# Patient Record
Sex: Male | Born: 1940
Health system: Southern US, Community
[De-identification: ages and names within clinical notes are randomized; demographics above are authoritative.]

## PROBLEM LIST (undated history)

## (undated) DIAGNOSIS — D696 Thrombocytopenia, unspecified: Secondary | ICD-10-CM

## (undated) DIAGNOSIS — I10 Essential (primary) hypertension: Secondary | ICD-10-CM

## (undated) DIAGNOSIS — E7800 Pure hypercholesterolemia, unspecified: Secondary | ICD-10-CM

## (undated) DIAGNOSIS — R195 Other fecal abnormalities: Secondary | ICD-10-CM

## (undated) DIAGNOSIS — C61 Malignant neoplasm of prostate: Secondary | ICD-10-CM

## (undated) DIAGNOSIS — E538 Deficiency of other specified B group vitamins: Secondary | ICD-10-CM

## (undated) DIAGNOSIS — G500 Trigeminal neuralgia: Secondary | ICD-10-CM

## (undated) DIAGNOSIS — F419 Anxiety disorder, unspecified: Secondary | ICD-10-CM

## (undated) HISTORY — DX: Thrombocytopenia, unspecified: D69.6

## (undated) HISTORY — DX: Trigeminal neuralgia: G50.0

## (undated) HISTORY — DX: Malignant neoplasm of prostate: C61

## (undated) HISTORY — DX: Other fecal abnormalities: R19.5

## (undated) HISTORY — DX: Deficiency of other specified B group vitamins: E53.8

## (undated) HISTORY — PX: PROSTATE SURGERY: SHX751

## (undated) HISTORY — DX: Anxiety disorder, unspecified: F41.9

## (undated) HISTORY — DX: Pure hypercholesterolemia, unspecified: E78.00

## (undated) HISTORY — DX: Essential (primary) hypertension: I10

## (undated) HISTORY — PX: HERNIA REPAIR: SHX51

---

## 2009-06-01 ENCOUNTER — Ambulatory Visit: Admission: RE | Admit: 2009-06-01 | Discharge: 2009-06-05 | Payer: Self-pay | Admitting: Radiation Oncology

## 2009-09-22 ENCOUNTER — Ambulatory Visit: Admission: RE | Admit: 2009-09-22 | Discharge: 2009-12-15 | Payer: Self-pay | Admitting: Radiation Oncology

## 2009-10-30 ENCOUNTER — Ambulatory Visit (HOSPITAL_BASED_OUTPATIENT_CLINIC_OR_DEPARTMENT_OTHER): Admission: RE | Admit: 2009-10-30 | Discharge: 2009-10-30 | Payer: Self-pay | Admitting: Urology

## 2010-01-16 ENCOUNTER — Ambulatory Visit: Admission: RE | Admit: 2010-01-16 | Discharge: 2010-01-19 | Payer: Self-pay | Admitting: Radiation Oncology

## 2010-06-01 ENCOUNTER — Ambulatory Visit: Payer: Self-pay | Admitting: Urology

## 2010-06-08 ENCOUNTER — Ambulatory Visit (INDEPENDENT_AMBULATORY_CARE_PROVIDER_SITE_OTHER): Payer: Medicare Other | Admitting: Urology

## 2010-06-08 DIAGNOSIS — R972 Elevated prostate specific antigen [PSA]: Secondary | ICD-10-CM

## 2010-06-08 DIAGNOSIS — Z8546 Personal history of malignant neoplasm of prostate: Secondary | ICD-10-CM

## 2010-12-28 ENCOUNTER — Ambulatory Visit (INDEPENDENT_AMBULATORY_CARE_PROVIDER_SITE_OTHER): Payer: Medicare Other | Admitting: Urology

## 2010-12-28 DIAGNOSIS — N402 Nodular prostate without lower urinary tract symptoms: Secondary | ICD-10-CM

## 2010-12-28 DIAGNOSIS — Z8546 Personal history of malignant neoplasm of prostate: Secondary | ICD-10-CM

## 2011-06-24 ENCOUNTER — Encounter: Payer: Self-pay | Admitting: *Deleted

## 2011-06-28 ENCOUNTER — Ambulatory Visit (INDEPENDENT_AMBULATORY_CARE_PROVIDER_SITE_OTHER): Payer: Medicare Other | Admitting: Urology

## 2011-06-28 DIAGNOSIS — Z8546 Personal history of malignant neoplasm of prostate: Secondary | ICD-10-CM

## 2011-12-27 ENCOUNTER — Ambulatory Visit: Payer: Medicare Other | Admitting: Urology

## 2012-01-10 ENCOUNTER — Ambulatory Visit (INDEPENDENT_AMBULATORY_CARE_PROVIDER_SITE_OTHER): Payer: Medicare Other | Admitting: Urology

## 2012-01-10 DIAGNOSIS — Z8546 Personal history of malignant neoplasm of prostate: Secondary | ICD-10-CM

## 2013-01-08 ENCOUNTER — Encounter (INDEPENDENT_AMBULATORY_CARE_PROVIDER_SITE_OTHER): Payer: Self-pay

## 2013-01-08 ENCOUNTER — Ambulatory Visit (INDEPENDENT_AMBULATORY_CARE_PROVIDER_SITE_OTHER): Payer: Medicare Other | Admitting: Urology

## 2013-01-08 DIAGNOSIS — Z8546 Personal history of malignant neoplasm of prostate: Secondary | ICD-10-CM

## 2014-01-07 ENCOUNTER — Ambulatory Visit (INDEPENDENT_AMBULATORY_CARE_PROVIDER_SITE_OTHER): Payer: Medicare Other | Admitting: Urology

## 2014-01-07 DIAGNOSIS — Z8546 Personal history of malignant neoplasm of prostate: Secondary | ICD-10-CM

## 2015-01-06 ENCOUNTER — Ambulatory Visit (INDEPENDENT_AMBULATORY_CARE_PROVIDER_SITE_OTHER): Payer: Medicare Other | Admitting: Urology

## 2015-01-06 DIAGNOSIS — Z8546 Personal history of malignant neoplasm of prostate: Secondary | ICD-10-CM | POA: Diagnosis not present

## 2015-08-22 DIAGNOSIS — J310 Chronic rhinitis: Secondary | ICD-10-CM | POA: Diagnosis not present

## 2015-08-22 DIAGNOSIS — H6123 Impacted cerumen, bilateral: Secondary | ICD-10-CM | POA: Diagnosis not present

## 2015-08-22 DIAGNOSIS — J0100 Acute maxillary sinusitis, unspecified: Secondary | ICD-10-CM | POA: Diagnosis not present

## 2015-08-31 DIAGNOSIS — J0100 Acute maxillary sinusitis, unspecified: Secondary | ICD-10-CM | POA: Diagnosis not present

## 2015-08-31 DIAGNOSIS — R05 Cough: Secondary | ICD-10-CM | POA: Diagnosis not present

## 2015-08-31 DIAGNOSIS — J310 Chronic rhinitis: Secondary | ICD-10-CM | POA: Diagnosis not present

## 2015-10-18 DIAGNOSIS — Z0000 Encounter for general adult medical examination without abnormal findings: Secondary | ICD-10-CM | POA: Diagnosis not present

## 2015-10-18 DIAGNOSIS — E7801 Familial hypercholesterolemia: Secondary | ICD-10-CM | POA: Diagnosis not present

## 2015-10-18 DIAGNOSIS — C61 Malignant neoplasm of prostate: Secondary | ICD-10-CM | POA: Diagnosis not present

## 2015-10-18 DIAGNOSIS — Z1211 Encounter for screening for malignant neoplasm of colon: Secondary | ICD-10-CM | POA: Diagnosis not present

## 2015-10-18 DIAGNOSIS — I10 Essential (primary) hypertension: Secondary | ICD-10-CM | POA: Diagnosis not present

## 2015-10-18 DIAGNOSIS — Z6825 Body mass index (BMI) 25.0-25.9, adult: Secondary | ICD-10-CM | POA: Diagnosis not present

## 2015-10-18 DIAGNOSIS — E785 Hyperlipidemia, unspecified: Secondary | ICD-10-CM | POA: Diagnosis not present

## 2015-12-26 DIAGNOSIS — J40 Bronchitis, not specified as acute or chronic: Secondary | ICD-10-CM | POA: Diagnosis not present

## 2015-12-26 DIAGNOSIS — Z23 Encounter for immunization: Secondary | ICD-10-CM | POA: Diagnosis not present

## 2015-12-29 DIAGNOSIS — J310 Chronic rhinitis: Secondary | ICD-10-CM | POA: Diagnosis not present

## 2016-01-05 DIAGNOSIS — J310 Chronic rhinitis: Secondary | ICD-10-CM | POA: Diagnosis not present

## 2016-01-05 DIAGNOSIS — J40 Bronchitis, not specified as acute or chronic: Secondary | ICD-10-CM | POA: Diagnosis not present

## 2016-01-05 DIAGNOSIS — J209 Acute bronchitis, unspecified: Secondary | ICD-10-CM | POA: Diagnosis not present

## 2016-01-05 DIAGNOSIS — J301 Allergic rhinitis due to pollen: Secondary | ICD-10-CM | POA: Diagnosis not present

## 2016-01-12 ENCOUNTER — Ambulatory Visit (INDEPENDENT_AMBULATORY_CARE_PROVIDER_SITE_OTHER): Payer: Medicare Other | Admitting: Urology

## 2016-01-12 DIAGNOSIS — Z8546 Personal history of malignant neoplasm of prostate: Secondary | ICD-10-CM

## 2016-01-15 DIAGNOSIS — J019 Acute sinusitis, unspecified: Secondary | ICD-10-CM | POA: Diagnosis not present

## 2016-06-24 DIAGNOSIS — J069 Acute upper respiratory infection, unspecified: Secondary | ICD-10-CM | POA: Diagnosis not present

## 2016-07-01 DIAGNOSIS — I10 Essential (primary) hypertension: Secondary | ICD-10-CM | POA: Diagnosis not present

## 2016-07-01 DIAGNOSIS — J301 Allergic rhinitis due to pollen: Secondary | ICD-10-CM | POA: Diagnosis not present

## 2016-11-06 DIAGNOSIS — Z6826 Body mass index (BMI) 26.0-26.9, adult: Secondary | ICD-10-CM | POA: Diagnosis not present

## 2016-11-06 DIAGNOSIS — E782 Mixed hyperlipidemia: Secondary | ICD-10-CM | POA: Diagnosis not present

## 2016-11-06 DIAGNOSIS — I10 Essential (primary) hypertension: Secondary | ICD-10-CM | POA: Diagnosis not present

## 2016-11-06 DIAGNOSIS — C61 Malignant neoplasm of prostate: Secondary | ICD-10-CM | POA: Diagnosis not present

## 2017-01-08 DIAGNOSIS — S161XXA Strain of muscle, fascia and tendon at neck level, initial encounter: Secondary | ICD-10-CM | POA: Diagnosis not present

## 2017-01-08 DIAGNOSIS — I10 Essential (primary) hypertension: Secondary | ICD-10-CM | POA: Diagnosis not present

## 2017-01-08 DIAGNOSIS — S0101XA Laceration without foreign body of scalp, initial encounter: Secondary | ICD-10-CM | POA: Diagnosis not present

## 2017-01-08 DIAGNOSIS — M542 Cervicalgia: Secondary | ICD-10-CM | POA: Diagnosis not present

## 2017-01-08 DIAGNOSIS — R51 Headache: Secondary | ICD-10-CM | POA: Diagnosis not present

## 2017-01-08 DIAGNOSIS — Z7982 Long term (current) use of aspirin: Secondary | ICD-10-CM | POA: Diagnosis not present

## 2017-01-08 DIAGNOSIS — Z79899 Other long term (current) drug therapy: Secondary | ICD-10-CM | POA: Diagnosis not present

## 2017-01-08 DIAGNOSIS — W11XXXA Fall on and from ladder, initial encounter: Secondary | ICD-10-CM | POA: Diagnosis not present

## 2017-01-08 DIAGNOSIS — S199XXA Unspecified injury of neck, initial encounter: Secondary | ICD-10-CM | POA: Diagnosis not present

## 2017-01-08 DIAGNOSIS — S0990XA Unspecified injury of head, initial encounter: Secondary | ICD-10-CM | POA: Diagnosis not present

## 2017-01-08 DIAGNOSIS — Z8546 Personal history of malignant neoplasm of prostate: Secondary | ICD-10-CM | POA: Diagnosis not present

## 2017-01-14 DIAGNOSIS — Z23 Encounter for immunization: Secondary | ICD-10-CM | POA: Diagnosis not present

## 2017-01-14 DIAGNOSIS — C61 Malignant neoplasm of prostate: Secondary | ICD-10-CM | POA: Diagnosis not present

## 2017-01-14 DIAGNOSIS — I10 Essential (primary) hypertension: Secondary | ICD-10-CM | POA: Diagnosis not present

## 2017-01-14 DIAGNOSIS — E782 Mixed hyperlipidemia: Secondary | ICD-10-CM | POA: Diagnosis not present

## 2017-01-17 ENCOUNTER — Ambulatory Visit: Payer: Medicare Other | Admitting: Urology

## 2017-05-01 DIAGNOSIS — Z0001 Encounter for general adult medical examination with abnormal findings: Secondary | ICD-10-CM | POA: Diagnosis not present

## 2017-05-01 DIAGNOSIS — Z6826 Body mass index (BMI) 26.0-26.9, adult: Secondary | ICD-10-CM | POA: Diagnosis not present

## 2017-05-01 DIAGNOSIS — I10 Essential (primary) hypertension: Secondary | ICD-10-CM | POA: Diagnosis not present

## 2017-05-01 DIAGNOSIS — E782 Mixed hyperlipidemia: Secondary | ICD-10-CM | POA: Diagnosis not present

## 2017-05-01 DIAGNOSIS — C61 Malignant neoplasm of prostate: Secondary | ICD-10-CM | POA: Diagnosis not present

## 2017-05-07 DIAGNOSIS — I10 Essential (primary) hypertension: Secondary | ICD-10-CM | POA: Diagnosis not present

## 2017-05-07 DIAGNOSIS — Z0001 Encounter for general adult medical examination with abnormal findings: Secondary | ICD-10-CM | POA: Diagnosis not present

## 2017-05-07 DIAGNOSIS — E782 Mixed hyperlipidemia: Secondary | ICD-10-CM | POA: Diagnosis not present

## 2017-05-07 DIAGNOSIS — C61 Malignant neoplasm of prostate: Secondary | ICD-10-CM | POA: Diagnosis not present

## 2017-05-26 DIAGNOSIS — Z1212 Encounter for screening for malignant neoplasm of rectum: Secondary | ICD-10-CM | POA: Diagnosis not present

## 2017-05-26 DIAGNOSIS — Z1211 Encounter for screening for malignant neoplasm of colon: Secondary | ICD-10-CM | POA: Diagnosis not present

## 2017-06-03 LAB — COLOGUARD: Cologuard: POSITIVE

## 2017-06-09 DIAGNOSIS — M19041 Primary osteoarthritis, right hand: Secondary | ICD-10-CM | POA: Diagnosis not present

## 2017-06-09 DIAGNOSIS — Z6826 Body mass index (BMI) 26.0-26.9, adult: Secondary | ICD-10-CM | POA: Diagnosis not present

## 2017-07-02 ENCOUNTER — Telehealth (INDEPENDENT_AMBULATORY_CARE_PROVIDER_SITE_OTHER): Payer: Self-pay | Admitting: *Deleted

## 2017-07-02 ENCOUNTER — Encounter (INDEPENDENT_AMBULATORY_CARE_PROVIDER_SITE_OTHER): Payer: Self-pay | Admitting: Internal Medicine

## 2017-07-02 ENCOUNTER — Ambulatory Visit (INDEPENDENT_AMBULATORY_CARE_PROVIDER_SITE_OTHER): Payer: Medicare Other | Admitting: Internal Medicine

## 2017-07-02 ENCOUNTER — Encounter (INDEPENDENT_AMBULATORY_CARE_PROVIDER_SITE_OTHER): Payer: Self-pay | Admitting: *Deleted

## 2017-07-02 DIAGNOSIS — E7800 Pure hypercholesterolemia, unspecified: Secondary | ICD-10-CM | POA: Insufficient documentation

## 2017-07-02 DIAGNOSIS — I10 Essential (primary) hypertension: Secondary | ICD-10-CM | POA: Insufficient documentation

## 2017-07-02 DIAGNOSIS — R195 Other fecal abnormalities: Secondary | ICD-10-CM

## 2017-07-02 MED ORDER — PEG 3350-KCL-NA BICARB-NACL 420 G PO SOLR: 4000.0000 mL | Freq: Once | ORAL | 0 refills | Status: AC

## 2017-07-02 NOTE — Progress Notes (Signed)
   Subjective:    Patient ID: Don Williams, male    DOB: 15-Dec-1940, 77 y.o.   MRN: [id number]  HPI Referred by Dr. Quillian Quince for positive cologuard. States he had been taking Pepto Bismol for constipation. Last colonoscopy was in 2013 by Dr. Lindalou Hose and patient states he had polyps. ( I will try to get records). No change in his stools. No melena or BRRB.  His appetite is good. No weight loss. No family hx of colon cancer.      Review of Systems Past Medical History:  Diagnosis Date  . High cholesterol   . Hypertension   . Positive colorectal cancer screening using Cologuard test       No Known Allergies  Current Outpatient Medications on File Prior to Visit  Medication Sig Dispense Refill  . acetaminophen (TYLENOL) 325 MG tablet Take 650 mg by mouth every 6 (six) hours as needed.    Marland Kitchen atenolol (TENORMIN) 50 MG tablet Take 50 mg by mouth 2 (two) times daily.    . meloxicam (MOBIC) 15 MG tablet Take 15 mg by mouth as needed for pain.    . simvastatin (ZOCOR) 40 MG tablet Take 40 mg by mouth daily.     No current facility-administered medications on file prior to visit.         Objective:   Physical Exam Blood pressure 130/82, pulse 60, temperature 98.2 F (36.8 C), height 5\' 10"  (1.778 m), weight 180 lb (81.6 kg).  Alert and oriented. Skin warm and dry. Oral mucosa is moist.   . Sclera anicteric, conjunctivae is pink. Thyroid not enlarged. No cervical lymphadenopathy. Lungs clear. Heart regular rate and rhythm.  Abdomen is soft. Bowel sounds are positive. No hepatomegaly. No abdominal masses felt. No tenderness.  No edema to lower extremities.         Assessment & Plan:  Positive cologuard  Colonic neoplasm needs to be ruled out.

## 2017-07-02 NOTE — Patient Instructions (Signed)
The risks of bleeding, perforation and infection were reviewed with patient.  

## 2017-07-02 NOTE — Telephone Encounter (Signed)
Patient needs trilyte 

## 2017-07-17 ENCOUNTER — Encounter (HOSPITAL_COMMUNITY): Payer: Self-pay

## 2017-07-17 ENCOUNTER — Encounter (HOSPITAL_COMMUNITY)
Admission: RE | Disposition: A | Discharge status: Home / Self Care | Payer: Self-pay | Source: Ambulatory Visit | Attending: Internal Medicine

## 2017-07-17 ENCOUNTER — Other Ambulatory Visit: Payer: Self-pay

## 2017-07-17 ENCOUNTER — Ambulatory Visit (HOSPITAL_COMMUNITY)
Admission: RE | Admit: 2017-07-17 | Discharge: 2017-07-17 | Disposition: A | Discharge status: Home / Self Care | Payer: Medicare Other | Source: Ambulatory Visit | Attending: Internal Medicine | Admitting: Internal Medicine

## 2017-07-17 DIAGNOSIS — K648 Other hemorrhoids: Secondary | ICD-10-CM | POA: Insufficient documentation

## 2017-07-17 DIAGNOSIS — I10 Essential (primary) hypertension: Secondary | ICD-10-CM | POA: Diagnosis not present

## 2017-07-17 DIAGNOSIS — R195 Other fecal abnormalities: Secondary | ICD-10-CM | POA: Insufficient documentation

## 2017-07-17 DIAGNOSIS — D123 Benign neoplasm of transverse colon: Secondary | ICD-10-CM | POA: Diagnosis not present

## 2017-07-17 DIAGNOSIS — D120 Benign neoplasm of cecum: Secondary | ICD-10-CM | POA: Insufficient documentation

## 2017-07-17 DIAGNOSIS — E7800 Pure hypercholesterolemia, unspecified: Secondary | ICD-10-CM | POA: Diagnosis not present

## 2017-07-17 DIAGNOSIS — Z79899 Other long term (current) drug therapy: Secondary | ICD-10-CM | POA: Insufficient documentation

## 2017-07-17 DIAGNOSIS — K644 Residual hemorrhoidal skin tags: Secondary | ICD-10-CM | POA: Diagnosis not present

## 2017-07-17 DIAGNOSIS — K6389 Other specified diseases of intestine: Secondary | ICD-10-CM | POA: Diagnosis not present

## 2017-07-17 DIAGNOSIS — K5730 Diverticulosis of large intestine without perforation or abscess without bleeding: Secondary | ICD-10-CM | POA: Insufficient documentation

## 2017-07-17 DIAGNOSIS — D122 Benign neoplasm of ascending colon: Secondary | ICD-10-CM | POA: Diagnosis not present

## 2017-07-17 HISTORY — PX: POLYPECTOMY: SHX5525

## 2017-07-17 HISTORY — PX: COLONOSCOPY: SHX5424

## 2017-07-17 HISTORY — PX: BIOPSY: SHX5522

## 2017-07-17 SURGERY — COLONOSCOPY
Anesthesia: Moderate Sedation

## 2017-07-17 MED ORDER — MIDAZOLAM HCL 5 MG/5ML IJ SOLN
INTRAMUSCULAR | Status: AC
  Filled 2017-07-17: qty 10

## 2017-07-17 MED ORDER — MIDAZOLAM HCL 5 MG/5ML IJ SOLN
INTRAMUSCULAR | Status: DC | PRN
  Administered 2017-07-17: 2 mg via INTRAVENOUS

## 2017-07-17 MED ORDER — MEPERIDINE HCL 50 MG/ML IJ SOLN
INTRAMUSCULAR | Status: DC | PRN
  Administered 2017-07-17 (×2): 25 mg via INTRAVENOUS

## 2017-07-17 MED ORDER — SODIUM CHLORIDE 0.9 % IV SOLN
INTRAVENOUS | Status: DC
  Administered 2017-07-17: 07:00:00 via INTRAVENOUS

## 2017-07-17 MED ORDER — STERILE WATER FOR IRRIGATION IR SOLN
Status: DC | PRN
  Administered 2017-07-17: 2.5 mL

## 2017-07-17 MED ORDER — MEPERIDINE HCL 50 MG/ML IJ SOLN
INTRAMUSCULAR | Status: AC
  Filled 2017-07-17: qty 1

## 2017-07-17 NOTE — Op Note (Signed)
[facility] Patient Name: Don Williams Procedure Date: 07/17/2017 7:06 AM MRN: [id number] Date of Birth: 07-Aug-1940 Attending MD: Hildred Laser , MD CSN: [id number] Age: 77 Admit Type: Outpatient Procedure:                Colonoscopy Indications:              Positive Cologuard test Providers:                Hildred Laser, MD, Lurline Del, RN, Aram Candela Referring MD:             Gar Ponto, MD Medicines:                Meperidine 50 mg IV, Midazolam 3 mg IV Complications:            No immediate complications. Estimated Blood Loss:     Estimated blood loss: none. Procedure:                Pre-Anesthesia Assessment:                           - Prior to the procedure, a History and Physical                            was performed, and patient medications and                            allergies were reviewed. The patient's tolerance of                            previous anesthesia was also reviewed. The risks                            and benefits of the procedure and the sedation                            options and risks were discussed with the patient.                            All questions were answered, and informed consent                            was obtained. Prior Anticoagulants: The patient                            last took previous NSAID medication 1 day prior to                            the procedure. ASA Grade Assessment: II - A patient                            with mild systemic disease. After reviewing the                            risks and benefits, the patient was deemed in  satisfactory condition to undergo the procedure.                           After obtaining informed consent, the colonoscope                            was passed under direct vision. Throughout the                            procedure, the patient's blood pressure, pulse, and                            oxygen saturations were monitored continuously.  The                            [id number] ([id number]) was introduced through the                            anus and advanced to the the terminal ileum, with                            identification of the appendiceal orifice and IC                            valve. The colonoscopy was performed without                            difficulty. The patient tolerated the procedure                            well. The quality of the bowel preparation was                            adequate. The terminal ileum, ileocecal valve,                            appendiceal orifice, and rectum were photographed. Scope In: 7:42:21 AM Scope Out: 8:23:17 AM Scope Withdrawal Time: 0 hours 35 minutes 24 seconds  Total Procedure Duration: 0 hours 40 minutes 56 seconds  Findings:      The perianal and digital rectal examinations were normal.      Two sessile polyps were found in the proximal transverse colon and       cecum. The polyps were small in size. These were biopsied with a cold       forceps for histology. The pathology specimen was placed into Bottle       Number 1.      Two sessile polyps were found in the cecum. The polyps were 4 to 10 mm       in size. These polyps were removed with a hot snare. Resection and       retrieval were complete. The pathology specimen was placed into Bottle       Number 1.      Three polyps were found in the ascending colon. The polyps were 8 to 10       mm in  size. These polyps were removed with a hot snare. Resection and       retrieval were complete. The pathology specimen was placed into Bottle       Number 2.      A small polyp was found in the ascending colon. The polyp was sessile.       The polyp was removed with a cold snare. Resection and retrieval were       complete. The pathology specimen was placed into Bottle Number 2.      The ileocecal valve was prominent. Biopsies were taken with a cold       forceps for histology. The pathology specimen was placed  into Bottle       Number 2.      Scattered medium-mouthed diverticula were found in the entire colon.      External and internal hemorrhoids were found during retroflexion. The       hemorrhoids were medium-sized. Impression:               - Two small polyps in the proximal transverse colon                            and in the cecum. Biopsied.                           - Two 4 to 10 mm polyps in the cecum, removed with                            a hot snare. Resected and retrieved.                           - Three 8 to 10 mm polyps in the ascending colon,                            removed with a hot snare. Resected and retrieved.                           - One small polyp in the ascending colon, removed                            with a cold snare. Resected and retrieved.                           - Prominent ileocecal valve. Biopsied.                           - Diverticulosis in the entire examined colon.                           - External and internal hemorrhoids.                           Comment: Patient had 8 polyps ranging in size from                            4 to 72mm all located in proximal. Moderate Sedation:  Moderate (conscious) sedation was administered by the endoscopy nurse       and supervised by the endoscopist. The following parameters were       monitored: oxygen saturation, heart rate, blood pressure, CO2       capnography and response to care. Total physician intraservice time was       46 minutes. Recommendation:           - Patient has a contact number available for                            emergencies. The signs and symptoms of potential                            delayed complications were discussed with the                            patient. Return to normal activities tomorrow.                            Written discharge instructions were provided to the                            patient.                           - High fiber diet today.                            - Continue present medications.                           - Await pathology results.                           - No aspirin, ibuprofen, naproxen, or other                            non-steroidal anti-inflammatory drugs for 7 days.                           - Repeat colonoscopy in 3 years for surveillance. Procedure Code(s):        --- Professional ---                           [phone], Colonoscopy, flexible; with removal of                            tumor(s), polyp(s), or other lesion(s) by snare                            technique                           [id number], 73, Colonoscopy, flexible; with biopsy,  single or multiple                           G0500, Moderate sedation services provided by the                            same physician or other qualified health care                            professional performing a gastrointestinal                            endoscopic service that sedation supports,                            requiring the presence of an independent trained                            observer to assist in the monitoring of the                            patient's level of consciousness and physiological                            status; initial 15 minutes of intra-service time;                            patient age 27 years or older (additional time may                            be reported with [phone], as appropriate)                           [phone], Moderate sedation services provided by the                            same physician or other qualified health care                            professional performing the diagnostic or                            therapeutic service that the sedation supports,                            requiring the presence of an independent trained                            observer to assist in the monitoring of the                            patient's level of consciousness and  physiological  status; each additional 15 minutes intraservice                            time (List separately in addition to code for                            primary service)                           [phone], Moderate sedation services provided by the                            same physician or other qualified health care                            professional performing the diagnostic or                            therapeutic service that the sedation supports,                            requiring the presence of an independent trained                            observer to assist in the monitoring of the                            patient's level of consciousness and physiological                            status; each additional 15 minutes intraservice                            time (List separately in addition to code for                            primary service) Diagnosis Code(s):        --- Professional ---                           D12.3, Benign neoplasm of transverse colon (hepatic                            flexure or splenic flexure)                           D12.0, Benign neoplasm of cecum                           D12.2, Benign neoplasm of ascending colon                           K63.89, Other specified diseases of intestine                           K64.8, Other  hemorrhoids                           R19.5, Other fecal abnormalities                           K57.30, Diverticulosis of large intestine without                            perforation or abscess without bleeding CPT copyright 2017 American Medical Association. All rights reserved. The codes documented in this report are preliminary and upon coder review may  be revised to meet current compliance requirements. Hildred Laser, MD Hildred Laser, MD 07/17/2017 8:37:21 AM This report has been signed electronically. Number of Addenda: 0

## 2017-07-17 NOTE — H&P (Signed)
Don Williams is an 77 y.o. male.   Chief Complaint: Patient is here for colonoscopy. HPI: Patient is 77 year old Caucasian male who was found to have positive Cologuard test.  He has no GI symptoms.  He denies abdominal pain melena or rectal bleeding or change in bowel habits.  His last colonoscopy at [facility] by Dr. Lindalou Hose was normal in 2013.  He had colonoscopy 5 years earlier by me with removal of a polyp or polyps. Family history is positive for CRC and a brother who is 35 at the time of diagnosis and died within a year.  Patient had been exposed to agent orange and other toxins during the [place] War.  Past Medical History:  Diagnosis Date  . High cholesterol   . Hypertension   .      Past Surgical History:  Procedure Laterality Date  . HERNIA REPAIR     wit hmesh  . PROSTATE SURGERY     hx of prostage cancer with implanted seeds    History reviewed. No pertinent family history. Social History:  reports that he has never smoked. He has never used smokeless tobacco. He reports that he does not drink alcohol or use drugs.  Allergies: No Known Allergies  Medications Prior to Admission  Medication Sig Dispense Refill  . acetaminophen (TYLENOL) 500 MG tablet Take 500 mg by mouth every 6 (six) hours as needed for moderate pain or headache.     Marland Kitchen atenolol (TENORMIN) 50 MG tablet Take 50 mg by mouth 2 (two) times daily.    . meloxicam (MOBIC) 15 MG tablet Take 15 mg by mouth daily as needed for pain.     . Multiple Vitamins-Minerals (MULTIVITAMIN PO) Take 1 tablet by mouth daily.    . simvastatin (ZOCOR) 40 MG tablet Take 40 mg by mouth every evening.       No results found for this or any previous visit (from the past 48 hour(s)). No results found.  ROS  Blood pressure (!) 149/81, pulse (!) 53, temperature 97.6 F (36.4 C), temperature source Oral, resp. rate 15, SpO2 96 %. Physical Exam  Constitutional: He appears well-developed and well-nourished.  HENT:  Mouth/Throat:  Oropharynx is clear and moist.  Eyes: Conjunctivae are normal. No scleral icterus.  Neck: No thyromegaly present.  Cardiovascular: Normal rate, regular rhythm and normal heart sounds.  No murmur heard. Respiratory: Effort normal and breath sounds normal.  GI: Soft. He exhibits no distension and no mass.  Musculoskeletal: He exhibits no edema.  Lymphadenopathy:    He has no cervical adenopathy.  Neurological: He is alert.  Skin: Skin is warm and dry.     Assessment/Plan Positive Cologuard test. Diagnostic colonoscopy.  Hildred Laser, MD 07/17/2017, 7:31 AM

## 2017-07-17 NOTE — Discharge Instructions (Signed)
No aspirin or NSAIDs for 1 week.  Resume other medications as before. High-fiber diet. No driving for 24 hours. Physician will call with biopsy results.   Colonoscopy, Adult, Care After This sheet gives you information about how to care for yourself after your procedure. Your doctor may also give you more specific instructions. If you have problems or questions, call your doctor. Follow these instructions at home: General instructions   For the first 24 hours after the procedure: ? Do not drive or use machinery. ? Do not sign important documents. ? Do not drink alcohol. ? Do your daily activities more slowly than normal. ? Eat foods that are soft and easy to digest. ? Rest often.  Take over-the-counter or prescription medicines only as told by your doctor.  It is up to you to get the results of your procedure. Ask your doctor, or the department performing the procedure, when your results will be ready. To help cramping and bloating:  Try walking around.  Put heat on your belly (abdomen) as told by your doctor. Use a heat source that your doctor recommends, such as a moist heat pack or a heating pad. ? Put a towel between your skin and the heat source. ? Leave the heat on for 20-30 minutes. ? Remove the heat if your skin turns bright red. This is especially important if you cannot feel pain, heat, or cold. You can get burned. Eating and drinking  Drink enough fluid to keep your pee (urine) clear or pale yellow.  Return to your normal diet as told by your doctor. Avoid heavy or fried foods that are hard to digest.  Avoid drinking alcohol for as long as told by your doctor. Contact a doctor if:  You have blood in your poop (stool) 2-3 days after the procedure. Get help right away if:  You have more than a small amount of blood in your poop.  You see large clumps of tissue (blood clots) in your poop.  Your belly is swollen.  You feel sick to your stomach  (nauseous).  You throw up (vomit).  You have a fever.  You have belly pain that gets worse, and medicine does not help your pain. This information is not intended to replace advice given to you by your health care provider. Make sure you discuss any questions you have with your health care provider. Document Released: 04/06/2010 Document Revised: 11/27/2015 Document Reviewed: 11/27/2015 Elsevier Interactive Patient Education  2017 [employer].  Diverticulosis Diverticulosis is a condition that develops when small pouches (diverticula) form in the wall of the large intestine (colon). The colon is where water is absorbed and stool is formed. The pouches form when the inside layer of the colon pushes through weak spots in the outer layers of the colon. You may have a few pouches or many of them. What are the causes? The cause of this condition is not known. What increases the risk? The following factors may make you more likely to develop this condition:  Being older than age 52. Your risk for this condition increases with age. Diverticulosis is rare among people younger than age 34. By age 72, many people have it.  Eating a low-fiber diet.  Having frequent constipation.  Being overweight.  Not getting enough exercise.  Smoking.  Taking over-the-counter pain medicines, like aspirin and ibuprofen.  Having a family history of diverticulosis.  What are the signs or symptoms? In most people, there are no symptoms of this condition. If  you do have symptoms, they may include:  Bloating.  Cramps in the abdomen.  Constipation or diarrhea.  Pain in the lower left side of the abdomen.  How is this diagnosed? This condition is most often diagnosed during an exam for other colon problems. Because diverticulosis usually has no symptoms, it often cannot be diagnosed independently. This condition may be diagnosed by:  Using a flexible scope to examine the colon  (colonoscopy).  Taking an X-ray of the colon after dye has been put into the colon (barium enema).  Doing a CT scan.  How is this treated? You may not need treatment for this condition if you have never developed an infection related to diverticulosis. If you have had an infection before, treatment may include:  Eating a high-fiber diet. This may include eating more fruits, vegetables, and grains.  Taking a fiber supplement.  Taking a live bacteria supplement (probiotic).  Taking medicine to relax your colon.  Taking antibiotic medicines.  Follow these instructions at home:  Drink 6-8 glasses of water or more each day to prevent constipation.  Try not to strain when you have a bowel movement.  If you have had an infection before: ? Eat more fiber as directed by your health care provider or your diet and nutrition specialist (dietitian). ? Take a fiber supplement or probiotic, if your health care provider approves.  Take over-the-counter and prescription medicines only as told by your health care provider.  If you were prescribed an antibiotic, take it as told by your health care provider. Do not stop taking the antibiotic even if you start to feel better.  Keep all follow-up visits as told by your health care provider. This is important. Contact a health care provider if:  You have pain in your abdomen.  You have bloating.  You have cramps.  You have not had a bowel movement in 3 days. Get help right away if:  Your pain gets worse.  Your bloating becomes very bad.  You have a fever or chills, and your symptoms suddenly get worse.  You vomit.  You have bowel movements that are bloody or black.  You have bleeding from your rectum. Summary  Diverticulosis is a condition that develops when small pouches (diverticula) form in the wall of the large intestine (colon).  You may have a few pouches or many of them.  This condition is most often diagnosed during an  exam for other colon problems.  If you have had an infection related to diverticulosis, treatment may include increasing the fiber in your diet, taking supplements, or taking medicines. This information is not intended to replace advice given to you by your health care provider. Make sure you discuss any questions you have with your health care provider. Document Released: 11/30/2003 Document Revised: 01/22/2016 Document Reviewed: 01/22/2016 Elsevier Interactive Patient Education  2017 [employer].  Colon Polyps Polyps are tissue growths inside the body. Polyps can grow in many places, including the large intestine (colon). A polyp may be a round bump or a mushroom-shaped growth. You could have one polyp or several. Most colon polyps are noncancerous (benign). However, some colon polyps can become cancerous over time. What are the causes? The exact cause of colon polyps is not known. What increases the risk? This condition is more likely to develop in people who:  Have a family history of colon cancer or colon polyps.  Are older than 62 or older than 45 if they are African American.  Have  inflammatory bowel disease, such as ulcerative colitis or Crohn disease.  Are overweight.  Smoke cigarettes.  Do not get enough exercise.  Drink too much alcohol.  Eat a diet that is: ? High in fat and red meat. ? Low in fiber.  Had childhood cancer that was treated with abdominal radiation.  What are the signs or symptoms? Most polyps do not cause symptoms. If you have symptoms, they may include:  Blood coming from your rectum when having a bowel movement.  Blood in your stool.The stool may look dark red or black.  A change in bowel habits, such as constipation or diarrhea.  How is this diagnosed? This condition is diagnosed with a colonoscopy. This is a procedure that uses a lighted, flexible scope to look at the inside of your colon. How is this treated? Treatment for this  condition involves removing any polyps that are found. Those polyps will then be tested for cancer. If cancer is found, your health care provider will talk to you about options for colon cancer treatment. Follow these instructions at home: Diet  Eat plenty of fiber, such as fruits, vegetables, and whole grains.  Eat foods that are high in calcium and vitamin D, such as milk, cheese, yogurt, eggs, liver, fish, and broccoli.  Limit foods high in fat, red meats, and processed meats, such as hot dogs, sausage, bacon, and lunch meats.  Maintain a healthy weight, or lose weight if recommended by your health care provider. General instructions  Do not smoke cigarettes.  Do not drink alcohol excessively.  Keep all follow-up visits as told by your health care provider. This is important. This includes keeping regularly scheduled colonoscopies. Talk to your health care provider about when you need a colonoscopy.  Exercise every day or as told by your health care provider. Contact a health care provider if:  You have new or worsening bleeding during a bowel movement.  You have new or increased blood in your stool.  You have a change in bowel habits.  You unexpectedly lose weight. This information is not intended to replace advice given to you by your health care provider. Make sure you discuss any questions you have with your health care provider. Document Released: 11/29/2003 Document Revised: 08/10/2015 Document Reviewed: 01/23/2015 Elsevier Interactive Patient Education  [employer].

## 2017-07-21 ENCOUNTER — Telehealth (INDEPENDENT_AMBULATORY_CARE_PROVIDER_SITE_OTHER): Payer: Self-pay | Admitting: Internal Medicine

## 2017-07-21 NOTE — Telephone Encounter (Signed)
Patient was called and a message was left with his wife, as he was not available. Advised that the polyps that were removed were not cancerous and Dr.Rehman would call the patient and go over results in more detail.

## 2017-07-21 NOTE — Telephone Encounter (Signed)
Patient would like Dr Laural Golden to call him about his colonoscopy results - ph# [phone] or [phone]

## 2017-07-21 NOTE — Telephone Encounter (Signed)
I have already called the patient.

## 2017-07-22 ENCOUNTER — Encounter (HOSPITAL_COMMUNITY): Payer: Self-pay | Admitting: Internal Medicine

## 2017-10-30 DIAGNOSIS — E782 Mixed hyperlipidemia: Secondary | ICD-10-CM | POA: Diagnosis not present

## 2017-10-30 DIAGNOSIS — I10 Essential (primary) hypertension: Secondary | ICD-10-CM | POA: Diagnosis not present

## 2017-10-30 DIAGNOSIS — C61 Malignant neoplasm of prostate: Secondary | ICD-10-CM | POA: Diagnosis not present

## 2017-11-05 DIAGNOSIS — Z23 Encounter for immunization: Secondary | ICD-10-CM | POA: Diagnosis not present

## 2017-11-05 DIAGNOSIS — E782 Mixed hyperlipidemia: Secondary | ICD-10-CM | POA: Diagnosis not present

## 2017-11-05 DIAGNOSIS — I10 Essential (primary) hypertension: Secondary | ICD-10-CM | POA: Diagnosis not present

## 2017-11-05 DIAGNOSIS — C61 Malignant neoplasm of prostate: Secondary | ICD-10-CM | POA: Diagnosis not present

## 2017-12-08 ENCOUNTER — Encounter (INDEPENDENT_AMBULATORY_CARE_PROVIDER_SITE_OTHER): Payer: Self-pay | Admitting: *Deleted

## 2017-12-19 DIAGNOSIS — Z6826 Body mass index (BMI) 26.0-26.9, adult: Secondary | ICD-10-CM | POA: Diagnosis not present

## 2017-12-19 DIAGNOSIS — J329 Chronic sinusitis, unspecified: Secondary | ICD-10-CM | POA: Diagnosis not present

## 2018-01-01 ENCOUNTER — Other Ambulatory Visit (INDEPENDENT_AMBULATORY_CARE_PROVIDER_SITE_OTHER): Payer: Self-pay | Admitting: *Deleted

## 2018-01-01 DIAGNOSIS — Z86010 Personal history of colonic polyps: Secondary | ICD-10-CM | POA: Insufficient documentation

## 2018-01-05 DIAGNOSIS — Z6825 Body mass index (BMI) 25.0-25.9, adult: Secondary | ICD-10-CM | POA: Diagnosis not present

## 2018-01-05 DIAGNOSIS — F331 Major depressive disorder, recurrent, moderate: Secondary | ICD-10-CM | POA: Diagnosis not present

## 2018-01-21 DIAGNOSIS — I10 Essential (primary) hypertension: Secondary | ICD-10-CM | POA: Diagnosis not present

## 2018-01-21 DIAGNOSIS — F331 Major depressive disorder, recurrent, moderate: Secondary | ICD-10-CM | POA: Diagnosis not present

## 2018-01-21 DIAGNOSIS — G252 Other specified forms of tremor: Secondary | ICD-10-CM | POA: Diagnosis not present

## 2018-01-21 DIAGNOSIS — Z6825 Body mass index (BMI) 25.0-25.9, adult: Secondary | ICD-10-CM | POA: Diagnosis not present

## 2018-02-05 DIAGNOSIS — Z23 Encounter for immunization: Secondary | ICD-10-CM | POA: Diagnosis not present

## 2018-02-18 ENCOUNTER — Telehealth (INDEPENDENT_AMBULATORY_CARE_PROVIDER_SITE_OTHER): Payer: Self-pay | Admitting: *Deleted

## 2018-02-18 ENCOUNTER — Encounter (INDEPENDENT_AMBULATORY_CARE_PROVIDER_SITE_OTHER): Payer: Self-pay | Admitting: *Deleted

## 2018-02-18 NOTE — Telephone Encounter (Signed)
Patient needs suprep 

## 2018-02-19 MED ORDER — SUPREP BOWEL PREP KIT 17.5-3.13-1.6 GM/177ML PO SOLN: 1.0000 | Freq: Once | ORAL | 0 refills | Status: AC

## 2018-03-09 ENCOUNTER — Telehealth (INDEPENDENT_AMBULATORY_CARE_PROVIDER_SITE_OTHER): Payer: Self-pay | Admitting: *Deleted

## 2018-03-09 NOTE — Telephone Encounter (Signed)
Referring MD/PCP: daniel   Procedure: tcs  Reason/Indication:  Hx polhyps  Has patient had this procedure before?  Yes, 07/2017  If so, when, by whom and where?    Is there a family history of colon cancer?  no  Who?  What age when diagnosed?    Is patient diabetic?   no      Does patient have prosthetic heart valve or mechanical valve?  no  Do you have a pacemaker?  no  Has patient ever had endocarditis? no  Has patient had joint replacement within last 12 months?  no  Is patient constipated or do they take laxatives? no  Does patient have a history of alcohol/drug use?  no  Is patient on blood thinner such as Coumadin, Plavix and/or Aspirin? no  Medications: atenolol 50 mg bid, simvastatin 40 mg daily, one a day vitamin, acetaminophen 500 mg prn  Allergies: nkda  Medication Adjustment per Dr Lindi Adie, NP:   Procedure date & time: 04/08/18 at 830

## 2018-03-12 NOTE — Telephone Encounter (Signed)
agree

## 2018-03-16 ENCOUNTER — Telehealth (INDEPENDENT_AMBULATORY_CARE_PROVIDER_SITE_OTHER): Payer: Self-pay | Admitting: *Deleted

## 2018-03-16 MED ORDER — SUPREP BOWEL PREP KIT 17.5-3.13-1.6 GM/177ML PO SOLN: 1.0000 | Freq: Once | ORAL | 0 refills | Status: AC

## 2018-03-16 NOTE — Telephone Encounter (Signed)
Patient needs suprep 

## 2018-04-08 ENCOUNTER — Encounter (HOSPITAL_COMMUNITY): Admission: RE | Payer: Self-pay | Source: Home / Self Care

## 2018-04-08 ENCOUNTER — Ambulatory Visit (HOSPITAL_COMMUNITY): Admission: RE | Admit: 2018-04-08 | Payer: Medicare Other | Source: Home / Self Care | Admitting: Internal Medicine

## 2018-04-08 SURGERY — COLONOSCOPY
Anesthesia: Moderate Sedation

## 2018-05-04 DIAGNOSIS — E782 Mixed hyperlipidemia: Secondary | ICD-10-CM | POA: Diagnosis not present

## 2018-05-04 DIAGNOSIS — Z6827 Body mass index (BMI) 27.0-27.9, adult: Secondary | ICD-10-CM | POA: Diagnosis not present

## 2018-05-04 DIAGNOSIS — I10 Essential (primary) hypertension: Secondary | ICD-10-CM | POA: Diagnosis not present

## 2018-05-04 DIAGNOSIS — Z0001 Encounter for general adult medical examination with abnormal findings: Secondary | ICD-10-CM | POA: Diagnosis not present

## 2018-05-04 DIAGNOSIS — F331 Major depressive disorder, recurrent, moderate: Secondary | ICD-10-CM | POA: Diagnosis not present

## 2018-05-08 DIAGNOSIS — I10 Essential (primary) hypertension: Secondary | ICD-10-CM | POA: Diagnosis not present

## 2018-05-08 DIAGNOSIS — Z6827 Body mass index (BMI) 27.0-27.9, adult: Secondary | ICD-10-CM | POA: Diagnosis not present

## 2018-05-08 DIAGNOSIS — F331 Major depressive disorder, recurrent, moderate: Secondary | ICD-10-CM | POA: Diagnosis not present

## 2018-05-08 DIAGNOSIS — Z0001 Encounter for general adult medical examination with abnormal findings: Secondary | ICD-10-CM | POA: Diagnosis not present

## 2018-05-13 DIAGNOSIS — Z6827 Body mass index (BMI) 27.0-27.9, adult: Secondary | ICD-10-CM | POA: Diagnosis not present

## 2018-05-13 DIAGNOSIS — J329 Chronic sinusitis, unspecified: Secondary | ICD-10-CM | POA: Diagnosis not present

## 2018-05-13 DIAGNOSIS — R509 Fever, unspecified: Secondary | ICD-10-CM | POA: Diagnosis not present

## 2018-11-05 DIAGNOSIS — I10 Essential (primary) hypertension: Secondary | ICD-10-CM | POA: Diagnosis not present

## 2018-11-05 DIAGNOSIS — E782 Mixed hyperlipidemia: Secondary | ICD-10-CM | POA: Diagnosis not present

## 2018-11-05 DIAGNOSIS — F331 Major depressive disorder, recurrent, moderate: Secondary | ICD-10-CM | POA: Diagnosis not present

## 2018-11-05 DIAGNOSIS — C61 Malignant neoplasm of prostate: Secondary | ICD-10-CM | POA: Diagnosis not present

## 2018-11-12 DIAGNOSIS — I10 Essential (primary) hypertension: Secondary | ICD-10-CM | POA: Diagnosis not present

## 2018-11-12 DIAGNOSIS — F331 Major depressive disorder, recurrent, moderate: Secondary | ICD-10-CM | POA: Diagnosis not present

## 2018-11-12 DIAGNOSIS — E782 Mixed hyperlipidemia: Secondary | ICD-10-CM | POA: Diagnosis not present

## 2018-11-12 DIAGNOSIS — Z6825 Body mass index (BMI) 25.0-25.9, adult: Secondary | ICD-10-CM | POA: Diagnosis not present

## 2018-11-30 DIAGNOSIS — Z23 Encounter for immunization: Secondary | ICD-10-CM | POA: Diagnosis not present

## 2019-02-16 DIAGNOSIS — J069 Acute upper respiratory infection, unspecified: Secondary | ICD-10-CM | POA: Diagnosis not present

## 2019-02-16 DIAGNOSIS — Z20828 Contact with and (suspected) exposure to other viral communicable diseases: Secondary | ICD-10-CM | POA: Diagnosis not present

## 2019-05-03 DIAGNOSIS — Z0001 Encounter for general adult medical examination with abnormal findings: Secondary | ICD-10-CM | POA: Diagnosis not present

## 2019-05-07 DIAGNOSIS — Z0001 Encounter for general adult medical examination with abnormal findings: Secondary | ICD-10-CM | POA: Diagnosis not present

## 2019-05-07 DIAGNOSIS — E782 Mixed hyperlipidemia: Secondary | ICD-10-CM | POA: Diagnosis not present

## 2019-05-07 DIAGNOSIS — I10 Essential (primary) hypertension: Secondary | ICD-10-CM | POA: Diagnosis not present

## 2019-05-07 DIAGNOSIS — F331 Major depressive disorder, recurrent, moderate: Secondary | ICD-10-CM | POA: Diagnosis not present

## 2019-06-07 DIAGNOSIS — H353131 Nonexudative age-related macular degeneration, bilateral, early dry stage: Secondary | ICD-10-CM | POA: Diagnosis not present

## 2019-06-07 DIAGNOSIS — Z961 Presence of intraocular lens: Secondary | ICD-10-CM | POA: Diagnosis not present

## 2019-06-07 DIAGNOSIS — H524 Presbyopia: Secondary | ICD-10-CM | POA: Diagnosis not present

## 2019-06-07 DIAGNOSIS — H26493 Other secondary cataract, bilateral: Secondary | ICD-10-CM | POA: Diagnosis not present

## 2019-07-14 DIAGNOSIS — R69 Illness, unspecified: Secondary | ICD-10-CM | POA: Diagnosis not present

## 2019-08-24 DIAGNOSIS — L719 Rosacea, unspecified: Secondary | ICD-10-CM | POA: Diagnosis not present

## 2019-08-24 DIAGNOSIS — L570 Actinic keratosis: Secondary | ICD-10-CM | POA: Diagnosis not present

## 2019-08-24 DIAGNOSIS — L309 Dermatitis, unspecified: Secondary | ICD-10-CM | POA: Diagnosis not present

## 2019-10-25 DIAGNOSIS — C61 Malignant neoplasm of prostate: Secondary | ICD-10-CM | POA: Diagnosis not present

## 2019-10-25 DIAGNOSIS — E782 Mixed hyperlipidemia: Secondary | ICD-10-CM | POA: Diagnosis not present

## 2019-10-25 DIAGNOSIS — I10 Essential (primary) hypertension: Secondary | ICD-10-CM | POA: Diagnosis not present

## 2019-10-25 DIAGNOSIS — R946 Abnormal results of thyroid function studies: Secondary | ICD-10-CM | POA: Diagnosis not present

## 2019-10-25 DIAGNOSIS — R7301 Impaired fasting glucose: Secondary | ICD-10-CM | POA: Diagnosis not present

## 2019-10-28 DIAGNOSIS — E782 Mixed hyperlipidemia: Secondary | ICD-10-CM | POA: Diagnosis not present

## 2019-10-28 DIAGNOSIS — R7301 Impaired fasting glucose: Secondary | ICD-10-CM | POA: Diagnosis not present

## 2019-10-28 DIAGNOSIS — F331 Major depressive disorder, recurrent, moderate: Secondary | ICD-10-CM | POA: Diagnosis not present

## 2019-10-28 DIAGNOSIS — I10 Essential (primary) hypertension: Secondary | ICD-10-CM | POA: Diagnosis not present

## 2020-03-01 DIAGNOSIS — L719 Rosacea, unspecified: Secondary | ICD-10-CM | POA: Diagnosis not present

## 2020-03-01 DIAGNOSIS — L570 Actinic keratosis: Secondary | ICD-10-CM | POA: Diagnosis not present

## 2020-03-01 DIAGNOSIS — L814 Other melanin hyperpigmentation: Secondary | ICD-10-CM | POA: Diagnosis not present

## 2020-04-18 DIAGNOSIS — J029 Acute pharyngitis, unspecified: Secondary | ICD-10-CM | POA: Diagnosis not present

## 2020-04-18 DIAGNOSIS — Z20828 Contact with and (suspected) exposure to other viral communicable diseases: Secondary | ICD-10-CM | POA: Diagnosis not present

## 2020-05-01 DIAGNOSIS — Z1329 Encounter for screening for other suspected endocrine disorder: Secondary | ICD-10-CM | POA: Diagnosis not present

## 2020-05-01 DIAGNOSIS — E782 Mixed hyperlipidemia: Secondary | ICD-10-CM | POA: Diagnosis not present

## 2020-05-01 DIAGNOSIS — E7849 Other hyperlipidemia: Secondary | ICD-10-CM | POA: Diagnosis not present

## 2020-05-01 DIAGNOSIS — I10 Essential (primary) hypertension: Secondary | ICD-10-CM | POA: Diagnosis not present

## 2020-05-01 DIAGNOSIS — R7301 Impaired fasting glucose: Secondary | ICD-10-CM | POA: Diagnosis not present

## 2020-05-03 DIAGNOSIS — R7301 Impaired fasting glucose: Secondary | ICD-10-CM | POA: Diagnosis not present

## 2020-05-03 DIAGNOSIS — J300 Vasomotor rhinitis: Secondary | ICD-10-CM | POA: Diagnosis not present

## 2020-05-03 DIAGNOSIS — I10 Essential (primary) hypertension: Secondary | ICD-10-CM | POA: Diagnosis not present

## 2020-05-03 DIAGNOSIS — E7849 Other hyperlipidemia: Secondary | ICD-10-CM | POA: Diagnosis not present

## 2020-06-29 DIAGNOSIS — Z23 Encounter for immunization: Secondary | ICD-10-CM | POA: Diagnosis not present

## 2020-07-28 DIAGNOSIS — H0288A Meibomian gland dysfunction right eye, upper and lower eyelids: Secondary | ICD-10-CM | POA: Diagnosis not present

## 2020-07-28 DIAGNOSIS — H0288B Meibomian gland dysfunction left eye, upper and lower eyelids: Secondary | ICD-10-CM | POA: Diagnosis not present

## 2020-07-28 DIAGNOSIS — L03213 Periorbital cellulitis: Secondary | ICD-10-CM | POA: Diagnosis not present

## 2020-08-01 DIAGNOSIS — G500 Trigeminal neuralgia: Secondary | ICD-10-CM | POA: Diagnosis not present

## 2020-08-01 DIAGNOSIS — I10 Essential (primary) hypertension: Secondary | ICD-10-CM | POA: Diagnosis not present

## 2020-08-01 DIAGNOSIS — Z6826 Body mass index (BMI) 26.0-26.9, adult: Secondary | ICD-10-CM | POA: Diagnosis not present

## 2020-08-11 DIAGNOSIS — I10 Essential (primary) hypertension: Secondary | ICD-10-CM | POA: Diagnosis not present

## 2020-08-11 DIAGNOSIS — E7849 Other hyperlipidemia: Secondary | ICD-10-CM | POA: Diagnosis not present

## 2020-08-11 DIAGNOSIS — Z6826 Body mass index (BMI) 26.0-26.9, adult: Secondary | ICD-10-CM | POA: Diagnosis not present

## 2020-08-11 DIAGNOSIS — G500 Trigeminal neuralgia: Secondary | ICD-10-CM | POA: Diagnosis not present

## 2020-10-30 DIAGNOSIS — I10 Essential (primary) hypertension: Secondary | ICD-10-CM | POA: Diagnosis not present

## 2020-10-30 DIAGNOSIS — Z1329 Encounter for screening for other suspected endocrine disorder: Secondary | ICD-10-CM | POA: Diagnosis not present

## 2020-10-30 DIAGNOSIS — R7301 Impaired fasting glucose: Secondary | ICD-10-CM | POA: Diagnosis not present

## 2020-10-30 DIAGNOSIS — E782 Mixed hyperlipidemia: Secondary | ICD-10-CM | POA: Diagnosis not present

## 2020-11-02 DIAGNOSIS — Z0001 Encounter for general adult medical examination with abnormal findings: Secondary | ICD-10-CM | POA: Diagnosis not present

## 2020-11-02 DIAGNOSIS — R7301 Impaired fasting glucose: Secondary | ICD-10-CM | POA: Diagnosis not present

## 2020-11-02 DIAGNOSIS — I10 Essential (primary) hypertension: Secondary | ICD-10-CM | POA: Diagnosis not present

## 2020-11-02 DIAGNOSIS — E7849 Other hyperlipidemia: Secondary | ICD-10-CM | POA: Diagnosis not present

## 2020-12-14 DIAGNOSIS — Z23 Encounter for immunization: Secondary | ICD-10-CM | POA: Diagnosis not present

## 2021-04-24 DIAGNOSIS — Z6827 Body mass index (BMI) 27.0-27.9, adult: Secondary | ICD-10-CM | POA: Diagnosis not present

## 2021-04-24 DIAGNOSIS — M461 Sacroiliitis, not elsewhere classified: Secondary | ICD-10-CM | POA: Diagnosis not present

## 2021-04-26 DIAGNOSIS — M461 Sacroiliitis, not elsewhere classified: Secondary | ICD-10-CM | POA: Diagnosis not present

## 2021-04-26 DIAGNOSIS — Z6827 Body mass index (BMI) 27.0-27.9, adult: Secondary | ICD-10-CM | POA: Diagnosis not present

## 2021-05-01 DIAGNOSIS — E782 Mixed hyperlipidemia: Secondary | ICD-10-CM | POA: Diagnosis not present

## 2021-05-01 DIAGNOSIS — I10 Essential (primary) hypertension: Secondary | ICD-10-CM | POA: Diagnosis not present

## 2021-05-01 DIAGNOSIS — R7301 Impaired fasting glucose: Secondary | ICD-10-CM | POA: Diagnosis not present

## 2021-05-04 DIAGNOSIS — R7301 Impaired fasting glucose: Secondary | ICD-10-CM | POA: Diagnosis not present

## 2021-05-04 DIAGNOSIS — J300 Vasomotor rhinitis: Secondary | ICD-10-CM | POA: Diagnosis not present

## 2021-05-04 DIAGNOSIS — I10 Essential (primary) hypertension: Secondary | ICD-10-CM | POA: Diagnosis not present

## 2021-05-04 DIAGNOSIS — E7849 Other hyperlipidemia: Secondary | ICD-10-CM | POA: Diagnosis not present

## 2021-05-05 ENCOUNTER — Emergency Department (HOSPITAL_COMMUNITY)
Admission: EM | Admit: 2021-05-05 | Discharge: 2021-05-05 | Disposition: A | Discharge status: Home / Self Care | Payer: Medicare Other | Attending: Emergency Medicine | Admitting: Emergency Medicine

## 2021-05-05 ENCOUNTER — Encounter (HOSPITAL_COMMUNITY): Payer: Self-pay | Admitting: *Deleted

## 2021-05-05 ENCOUNTER — Emergency Department (HOSPITAL_COMMUNITY): Payer: Medicare Other

## 2021-05-05 ENCOUNTER — Other Ambulatory Visit: Payer: Self-pay

## 2021-05-05 DIAGNOSIS — Z79899 Other long term (current) drug therapy: Secondary | ICD-10-CM | POA: Insufficient documentation

## 2021-05-05 DIAGNOSIS — M25552 Pain in left hip: Secondary | ICD-10-CM | POA: Insufficient documentation

## 2021-05-05 DIAGNOSIS — I10 Essential (primary) hypertension: Secondary | ICD-10-CM | POA: Diagnosis not present

## 2021-05-05 DIAGNOSIS — M533 Sacrococcygeal disorders, not elsewhere classified: Secondary | ICD-10-CM | POA: Diagnosis not present

## 2021-05-05 DIAGNOSIS — M7918 Myalgia, other site: Secondary | ICD-10-CM

## 2021-05-05 MED ORDER — CYCLOBENZAPRINE HCL 5 MG PO TABS: 10.0000 mg | ORAL_TABLET | Freq: Two times a day (BID) | ORAL | 0 refills | Status: AC | PRN

## 2021-05-05 MED ORDER — OXYCODONE-ACETAMINOPHEN 5-325 MG PO TABS: 1.0000 | ORAL_TABLET | Freq: Three times a day (TID) | ORAL | 0 refills | Status: AC | PRN

## 2021-05-05 NOTE — ED Provider Notes (Signed)
[facility] Provider Note   CSN: [id number] Arrival date & time: 05/05/21  1042     History  Chief Complaint  Patient presents with   Hip Pain    Don Williams is a 81 y.o. male.  HPI  Patient with pertinent medical history including previous hernia as well as hypertension presents with complaints of left-sided buttocks pain.  Patient states is gone for last 5 weeks, pain is worsened with movement especially after period of laying down, pain remains in his left buttocks, does not radiate, has no paresthesias or weakness moving down his extremities, denies saddle paresthesias urinary incontinency or retention, denies any flank tenderness stomach pains nausea vomiting diarrhea.  He does note that he will have occasional groin pain currently does not have this at this time, states that he was seen by his primary care doctor who thinks he has sciatica, he was given injections which does not seem to help, he is also tried some exercises also has not helped either.  He has not seen orthopedic doctor.  He has no other complaints. Home Medications Prior to Admission medications   Medication Sig Start Date End Date Taking? Authorizing Provider  cyclobenzaprine (FLEXERIL) 5 MG tablet Take 2 tablets (10 mg total) by mouth 2 (two) times daily as needed for up to 10 days for muscle spasms. 05/05/21 05/15/21 Yes Marcello Fennel, PA-C  oxyCODONE-acetaminophen (PERCOCET/ROXICET) 5-325 MG tablet Take 1 tablet by mouth every 8 (eight) hours as needed for up to 3 days for severe pain. 05/05/21 05/08/21 Yes Marcello Fennel, PA-C  acetaminophen (TYLENOL) 500 MG tablet Take 500 mg by mouth every 6 (six) hours as needed for moderate pain or headache.     [provider]  atenolol (TENORMIN) 50 MG tablet Take 50 mg by mouth 2 (two) times daily.    [provider]  Ibuprofen-diphenhydrAMINE HCl (IBUPROFEN PM) 200-25 MG CAPS Take 1 capsule by mouth at bedtime as needed  (sleep).    [provider]  Multiple Vitamins-Minerals (MULTIVITAMIN PO) Take 1 tablet by mouth daily.    [provider]  simvastatin (ZOCOR) 40 MG tablet Take 40 mg by mouth at bedtime.     [provider]      Allergies    Patient has no known allergies.    Review of Systems   Review of Systems  Constitutional:  Negative for chills and fever.  Respiratory:  Negative for shortness of breath.   Cardiovascular:  Negative for chest pain.  Gastrointestinal:  Negative for abdominal pain.  Musculoskeletal:        Left buttocks pain  Neurological:  Negative for headaches.   Physical Exam Updated Vital Signs BP (!) 153/75 (BP Location: Right Arm)    Pulse 66    Temp 97.7 F (36.5 C) (Oral)    Resp 20    Ht 5\' 9"  (1.753 m)    Wt 83.5 kg    SpO2 97%    BMI 27.17 kg/m  Physical Exam Vitals and nursing note reviewed. Exam conducted with a chaperone present.  Constitutional:      General: He is not in acute distress.    Appearance: He is not ill-appearing.  HENT:     Head: Normocephalic and atraumatic.     Nose: No congestion.  Eyes:     Conjunctiva/sclera: Conjunctivae normal.  Cardiovascular:     Rate and Rhythm: Normal rate and regular rhythm.     Pulses: Normal pulses.  Heart sounds: No murmur heard.   No friction rub. No gallop.  Pulmonary:     Effort: No respiratory distress.     Breath sounds: No wheezing, rhonchi or rales.  Abdominal:     Palpations: Abdomen is soft.     Tenderness: There is no abdominal tenderness. There is no right CVA tenderness or left CVA tenderness.  Genitourinary:    Comments: With chaperone presents patient's left groin was visualized, no palpable mass, or overlying skin changes, no palpable inguinal hernia. Musculoskeletal:     Comments: Spine was palpated is nontender to palpation no step-off deformities noted, no overlying skin changes.  Patient has 5-5 strength neurovascularly intact in lower extremities, no  pelvis instability.  Patient ambulate without difficulty.  Patient tenderness along his left buttocks reproducible focalized.  Skin:    General: Skin is warm and dry.  Neurological:     Mental Status: He is alert.  Psychiatric:        Mood and Affect: Mood normal.    ED Results / Procedures / Treatments   Labs (all labs ordered are listed, but only abnormal results are displayed) Labs Reviewed - No data to display  EKG None  Radiology DG Hip Unilat W or Wo Pelvis 2-3 Views Left  Result Date: 05/05/2021 CLINICAL DATA:  5 week history of left hip pain. EXAM: DG HIP (WITH OR WITHOUT PELVIS) 2-3V LEFT COMPARISON:  None. FINDINGS: Frontal pelvis shows no fracture. SI joints and symphysis pubis unremarkable. Brachytherapy seeds noted in the prostate bed. AP and frog-leg lateral views of the left hip show no femoral neck fracture. There is no suspicious lytic or sclerotic osseous abnormality in the proximal left femur. Patient is noted to have a subtle small lucency projecting over the right femoral head/acetabulum. IMPRESSION: No acute bony abnormality involving the anatomy of the left hip. Small lucency projects over the right femoral head/acetabulum. Dedicated right hip x-rays recommended to further evaluate. Electronically Signed   By: Misty Stanley M.D.   On: 05/05/2021 12:07    Procedures Procedures    Medications Ordered in ED Medications - No data to display  ED Course/ Medical Decision Making/ A&P                           Medical Decision Making Amount and/or Complexity of Data Reviewed Radiology: ordered.   This patient presents to the ED for concern of left buttocks pain, this involves an extensive number of treatment options, and is a complaint that carries with it a high risk of complications and morbidity.  The differential diagnosis includes sciatica, inguinal hernia, spine equina, pelvis/humeral fracture    Additional history obtained:  Additional history obtained  from electronic medical record    Co morbidities that complicate the patient evaluation  Hernia repair, hypertension  Social Determinants of Health:  Age    Lab Tests:  I Ordered, and personally interpreted labs.  The pertinent results include: N/A   Imaging Studies ordered:  I ordered imaging studies including DG of left hip I independently visualized and interpreted imaging which showed no acute abnormalities of the left hip, shows small transversely project over the right femoral head. I agree with the radiologist interpretation    Rule out I have low suspicion for spinal fracture or spinal cord abnormality as patient denies urinary incontinency, retention, difficulty with bowel movements, denies saddle paresthesias.  Spine was palpated there is no step-off, crepitus or gross deformities felt,  patient had 5/5 strength, full range of motion, neurovascular fully intact in the lower extremities.  Low suspicion for pelvis fracture of the lower femoral fracture as imaging negative for these findings, he does ambulate without difficulty.  Low suspicion for inguinal hernia is no palpable mass on exam, he still passing gas and having normal bowel movements.  Low suspicion for dissection as presentation atypical etiology pain is focalized and reproducible on his left buttocks.       Dispostion and problem list  After consideration of the diagnostic results and the patients response to treatment, I feel that the patent would benefit from discharge.  Left buttocks pain-suspect this is likely muscular in nature possible sciatica, will provide with a short course of muscle laxer as well as narcotic medication, have follow-up with orthopedics for further evaluation.  Gave strict return precautions.            Final Clinical Impression(s) / ED Diagnoses Final diagnoses:  Left buttock pain    Rx / DC Orders ED Discharge Orders          Ordered    cyclobenzaprine  (FLEXERIL) 5 MG tablet  2 times daily PRN        05/05/21 1257    oxyCODONE-acetaminophen (PERCOCET/ROXICET) 5-325 MG tablet  Every 8 hours PRN        05/05/21 1257              Marcello Fennel, PA-C 05/05/21 1259    Wynona Dove A, DO 05/05/21 1614

## 2021-05-05 NOTE — ED Triage Notes (Signed)
Pt with left hip pain for 5 weeks, denies any injury.  Pt has seen PCP for it and has received cortisone shots.

## 2021-05-05 NOTE — Discharge Instructions (Signed)
You have been seen here for left buttock pain I recommend taking over-the-counter pain medications like ibuprofen and/or Tylenol every 6 as needed.  Please follow dosage and on the back of bottle.  I also recommend applying heat to the area and stretching out the muscles as this will help decrease stiffness and pain.  I have given you information on exercises please follow.  I have given you a short course of narcotics please take as prescribed.  This medication can make you drowsy do not consume alcohol or operate heavy machinery when taking this medication.  This medication is Tylenol in it do not take Tylenol and take this medication.  I have also given you a prescription for a muscle relaxer this can make you drowsy do not consume alcohol or operate heavy machinery when taking this medication.   Please follow-up with orthopedics for further evaluation  Come back to the emergency department if you develop chest pain, shortness of breath, severe abdominal pain, uncontrolled nausea, vomiting, diarrhea.

## 2021-09-29 DIAGNOSIS — I10 Essential (primary) hypertension: Secondary | ICD-10-CM | POA: Diagnosis not present

## 2021-09-29 DIAGNOSIS — Z6826 Body mass index (BMI) 26.0-26.9, adult: Secondary | ICD-10-CM | POA: Diagnosis not present

## 2021-09-29 DIAGNOSIS — J069 Acute upper respiratory infection, unspecified: Secondary | ICD-10-CM | POA: Diagnosis not present

## 2021-10-17 DIAGNOSIS — L03113 Cellulitis of right upper limb: Secondary | ICD-10-CM | POA: Diagnosis not present

## 2021-10-17 DIAGNOSIS — Z6826 Body mass index (BMI) 26.0-26.9, adult: Secondary | ICD-10-CM | POA: Diagnosis not present

## 2021-10-17 DIAGNOSIS — M25521 Pain in right elbow: Secondary | ICD-10-CM | POA: Diagnosis not present

## 2021-10-22 DIAGNOSIS — I10 Essential (primary) hypertension: Secondary | ICD-10-CM | POA: Diagnosis not present

## 2021-10-22 DIAGNOSIS — L03113 Cellulitis of right upper limb: Secondary | ICD-10-CM | POA: Diagnosis not present

## 2021-10-22 DIAGNOSIS — Z6825 Body mass index (BMI) 25.0-25.9, adult: Secondary | ICD-10-CM | POA: Diagnosis not present

## 2021-10-25 ENCOUNTER — Emergency Department (HOSPITAL_COMMUNITY)
Admission: EM | Admit: 2021-10-25 | Discharge: 2021-10-25 | Disposition: A | Discharge status: Home / Self Care | Payer: Medicare Other | Attending: Emergency Medicine | Admitting: Emergency Medicine

## 2021-10-25 ENCOUNTER — Encounter (HOSPITAL_COMMUNITY): Payer: Self-pay | Admitting: Emergency Medicine

## 2021-10-25 ENCOUNTER — Emergency Department (HOSPITAL_COMMUNITY): Payer: Medicare Other

## 2021-10-25 ENCOUNTER — Other Ambulatory Visit: Payer: Self-pay

## 2021-10-25 DIAGNOSIS — W19XXXA Unspecified fall, initial encounter: Secondary | ICD-10-CM | POA: Diagnosis not present

## 2021-10-25 DIAGNOSIS — S0990XA Unspecified injury of head, initial encounter: Secondary | ICD-10-CM | POA: Diagnosis not present

## 2021-10-25 DIAGNOSIS — Z79899 Other long term (current) drug therapy: Secondary | ICD-10-CM | POA: Diagnosis not present

## 2021-10-25 DIAGNOSIS — Z043 Encounter for examination and observation following other accident: Secondary | ICD-10-CM | POA: Diagnosis not present

## 2021-10-25 DIAGNOSIS — M50323 Other cervical disc degeneration at C6-C7 level: Secondary | ICD-10-CM | POA: Diagnosis not present

## 2021-10-25 DIAGNOSIS — S0181XA Laceration without foreign body of other part of head, initial encounter: Secondary | ICD-10-CM | POA: Diagnosis not present

## 2021-10-25 DIAGNOSIS — I10 Essential (primary) hypertension: Secondary | ICD-10-CM | POA: Insufficient documentation

## 2021-10-25 DIAGNOSIS — M4312 Spondylolisthesis, cervical region: Secondary | ICD-10-CM | POA: Diagnosis not present

## 2021-10-25 LAB — CBG MONITORING, ED: Glucose-Capillary: 116 mg/dL — ABNORMAL HIGH (ref 70–99)

## 2021-10-25 MED ORDER — LIDOCAINE-EPINEPHRINE (PF) 2 %-1:200000 IJ SOLN
5.0000 mL | Freq: Once | INTRAMUSCULAR | Status: AC
  Administered 2021-10-25: 5 mL
  Filled 2021-10-25: qty 20

## 2021-10-25 NOTE — ED Provider Notes (Signed)
[facility] EMERGENCY DEPARTMENT Provider Note   CSN: [id number] Arrival date & time: 10/25/21  1259     History  Chief Complaint  Patient presents with   Don Williams is a 81 y.o. male.  81 year old male presents today for evaluation of syncopal episode.  Patient states he got up too quick and felt lightheaded and had a syncopal episode.  Patient states he fell forward hitting the concrete wall on the way down.  Denies chest pain, shortness of breath, palpitations or other symptoms surrounding this episode other than the lightheadedness after getting up suddenly.  States last week his blood pressure medication was adjusted from atenolol to 100 mg of losartan.  He states he does take this medication at bedtime.  Normal p.o. intake.  During my interview patient repeatedly expresses frustration that he had an EKG done when he arrived to the emergency room.  I had an extensive discussion regarding the importance of an EKG in the setting of syncopal episode.  He states his PCP recommended he get a head scan scan, and laceration repair.  He does not want any additional workup.  I discussed the importance of additional workup including blood work to evaluate for electrolyte abnormalities.  He refuses additional workup he feels fairly certain that this was because of his BP medication adjustment.  The history is provided by the patient. No language interpreter was used.       Home Medications Prior to Admission medications   Medication Sig Start Date End Date Taking? Authorizing Provider  acetaminophen (TYLENOL) 500 MG tablet Take 500 mg by mouth every 6 (six) hours as needed for moderate pain or headache.     [provider]  atenolol (TENORMIN) 50 MG tablet Take 50 mg by mouth 2 (two) times daily.    [provider]  Ibuprofen-diphenhydrAMINE HCl (IBUPROFEN PM) 200-25 MG CAPS Take 1 capsule by mouth at bedtime as needed (sleep).    [provider]  Multiple  Vitamins-Minerals (MULTIVITAMIN PO) Take 1 tablet by mouth daily.    [provider]  simvastatin (ZOCOR) 40 MG tablet Take 40 mg by mouth at bedtime.     [provider]      Allergies    Patient has no known allergies.    Review of Systems   Review of Systems  Constitutional:  Negative for chills and fever.  Eyes:  Negative for visual disturbance.  Respiratory:  Negative for shortness of breath.   Cardiovascular:  Negative for chest pain, palpitations and leg swelling.  Neurological:  Positive for syncope and light-headedness. Negative for weakness and headaches.  All other systems reviewed and are negative.   Physical Exam Updated Vital Signs BP (!) 155/79   Pulse 90   Temp 98.2 F (36.8 C) (Oral)   Resp 18   Ht '5\' 10"'$  (1.778 m)   Wt 79.4 kg   SpO2 97%   BMI 25.11 kg/m  Physical Exam Vitals and nursing note reviewed.  Constitutional:      General: He is not in acute distress.    Appearance: Normal appearance. He is not ill-appearing.  HENT:     Head: Normocephalic and atraumatic.  Cardiovascular:     Rate and Rhythm: Normal rate and regular rhythm.     Heart sounds: Murmur heard.  Pulmonary:     Effort: Pulmonary effort is normal. No respiratory distress.     Breath sounds: Normal breath sounds. No wheezing or rales.  Abdominal:     General: There is no distension.     Palpations: Abdomen is soft.     Tenderness: There is no abdominal tenderness. There is no guarding.  Musculoskeletal:        General: Normal range of motion.     Cervical back: Normal range of motion. No rigidity or tenderness.     Right lower leg: No edema.     Left lower leg: No edema.  Skin:    Comments: 2.5cm lac above right eyebrow  Neurological:     Mental Status: He is alert.     ED Results / Procedures / Treatments   Labs (all labs ordered are listed, but only abnormal results are displayed) Labs Reviewed  CBG MONITORING, ED - Abnormal; Notable for the  following components:      Result Value   Glucose-Capillary 116 (*)    All other components within normal limits  BASIC METABOLIC PANEL  CBC  URINALYSIS, ROUTINE W REFLEX MICROSCOPIC    EKG None  Radiology No results found.  Procedures .Marland KitchenLaceration Repair  Date/Time: 10/25/2021 5:19 PM  Performed by: Evlyn Courier, PA-C Authorized by: Evlyn Courier, PA-C   Consent:    Consent obtained:  Verbal   Consent given by:  Patient   Risks discussed:  Infection, need for additional repair, pain, poor cosmetic result and poor wound healing   Alternatives discussed:  No treatment and delayed treatment Universal protocol:    Procedure explained and questions answered to patient or proxy's satisfaction: yes     Relevant documents present and verified: yes     Test results available: yes     Patient identity confirmed:  Verbally with patient Anesthesia:    Anesthesia method:  Local infiltration   Local anesthetic:  Lidocaine 2% WITH epi (47ms) Laceration details:    Location:  Face   Face location:  Forehead   Length (cm):  2.5 Pre-procedure details:    Preparation:  Patient was prepped and draped in usual sterile fashion Exploration:    Contaminated: no   Treatment:    Area cleansed with:  Povidone-iodine and saline   Amount of cleaning:  Standard   Irrigation solution:  Sterile saline   Irrigation volume:  250   Irrigation method:  Pressure wash   Debridement:  None   Undermining:  None Skin repair:    Repair method:  Sutures   Suture size:  5-0   Suture material:  Prolene   Number of sutures:  3 Approximation:    Approximation:  Close Repair type:    Repair type:  Simple Post-procedure details:    Dressing:  Non-adherent dressing   Procedure completion:  Tolerated well, no immediate complications     Medications Ordered in ED Medications  lidocaine-EPINEPHrine (XYLOCAINE W/EPI) 2 %-1:200000 (PF) injection 5 mL (5 mLs Infiltration Given by Other 10/25/21 1452)    ED  Course/ Medical Decision Making/ A&P                           Medical Decision Making Amount and/or Complexity of Data Reviewed Labs: ordered. Radiology: ordered.  Risk Prescription drug management.   Medical Decision Making / ED Course   This patient presents to the ED for concern of syncopal episode, this involves an extensive number of treatment options, and is a complaint that carries with it a high risk of complications and morbidity.  The differential diagnosis includes vasovagal, dehydration, cardiac arrhythmia,  MDM: 81 year old male presents today following syncopal episode.  He reported lightheadedness from getting up too quick prior to the episode.  Denies chest pain, shortness of breath, other symptoms surrounding the episode.  He states that he also had a fall few weeks ago because he got up too quick.  He injured his right elbow during that fall.  No head injury.  He has been following with his PCP regarding right elbow injury.  From today's fall patient has a 2.5 cm laceration just above right eyebrow.  No pain with extraocular movements, or other injuries of facial bones.  CT head, cervical spine obtained without any acute process noted.  Extensive discussion had regarding additional workup to rule out concerning cause of syncope however patient refuses.  States he only wants to lack repaired, CT head.  He agreed to CT cervical spine.  He does have an abrasion to his right shoulder but refuses right shoulder x-ray.  Given he has good range of motion doubt shoulder fracture or other acute injury.  Laceration repaired with three 5-0 Prolene suture.  Murmur appreciated on exam.  Discussed murmur, as well as additional workup.  Patient again defers this today.  Offered cardiology referral but patient states his PCP is within [facility] and he would follow-up with them and obtain referral within [facility] system if needed.  Based off of workup today, and laceration repaired patient is  appropriate for discharge.  Discharged in stable condition.  Return precautions discussed. Patient states he had his tetanus shot updated last year.   Lab Tests: -I ordered, reviewed, and interpreted labs.   The pertinent results include:   Labs Reviewed  CBG MONITORING, ED - Abnormal; Notable for the following components:      Result Value   Glucose-Capillary 116 (*)    All other components within normal limits      EKG  EKG Interpretation  Date/Time:    Ventricular Rate:    PR Interval:    QRS Duration:   QT Interval:    QTC Calculation:   R Axis:     Text Interpretation:           Imaging Studies ordered: I ordered imaging studies including ct head, ct cervical spine I independently visualized and interpreted imaging. I agree with the radiologist interpretation   Medicines ordered and prescription drug management: Meds ordered this encounter  Medications   lidocaine-EPINEPHrine (XYLOCAINE W/EPI) 2 %-1:200000 (PF) injection 5 mL    -I have reviewed the patients home medicines and have made adjustments as needed  Reevaluation: After the interventions noted above, I reevaluated the patient and found that they have :stayed the same  Co morbidities that complicate the patient evaluation  Past Medical History:  Diagnosis Date   High cholesterol    Hypertension    Positive colorectal cancer screening using Cologuard test       Dispostion: Patient appropriate for discharge.  Discharged in stable condition.  Return precautions discussed.   Final Clinical Impression(s) / ED Diagnoses Final diagnoses:  Fall, initial encounter  Injury of head, initial encounter  Laceration of forehead, initial encounter    Rx / DC Orders ED Discharge Orders     None         Evlyn Courier, PA-C 10/25/21 1721    Elgie Congo, MD 10/26/21 1021

## 2021-10-25 NOTE — Discharge Instructions (Addendum)
Your exam today was overall reassuring.  EKG did not show anything concerning.  CT scan of the head, and neck did not show any concerning findings.  Did show degenerative changes of your cervical spine at level of C6-C7.  No injuries from today's fall.  Your laceration was repaired with 3 stitches.  These will need to be removed in 7 to 10 days.  You can have these removed at your primary care provider's office, or return to the urgent care or emergency room.  If you notice signs of infection including worsening swelling, redness, fever, or drainage please return or follow-up with your primary care provider.  I have attached information regarding how to care for the laceration repair above.  We discussed doing additional blood work and workup for your syncopal episode however you state that you were confident this happened because of the recent blood pressure medication change and that she got up too quick.  If you have any concerning symptoms such as chest pain, shortness of breath, palpitations please return to the emergency room for evaluation.

## 2021-10-25 NOTE — ED Notes (Signed)
Suture cart placed at bedside. 

## 2021-10-25 NOTE — ED Notes (Signed)
Patient transported to CT 

## 2021-10-25 NOTE — ED Triage Notes (Signed)
Pt presents for fall after having syncopal episode after standing up and walking into laundry room, fell hit concrete floor, laceration to right temple.

## 2021-10-29 DIAGNOSIS — Z6825 Body mass index (BMI) 25.0-25.9, adult: Secondary | ICD-10-CM | POA: Diagnosis not present

## 2021-10-29 DIAGNOSIS — R55 Syncope and collapse: Secondary | ICD-10-CM | POA: Diagnosis not present

## 2021-10-29 DIAGNOSIS — I10 Essential (primary) hypertension: Secondary | ICD-10-CM | POA: Diagnosis not present

## 2021-11-02 DIAGNOSIS — Z6825 Body mass index (BMI) 25.0-25.9, adult: Secondary | ICD-10-CM | POA: Diagnosis not present

## 2021-11-02 DIAGNOSIS — I10 Essential (primary) hypertension: Secondary | ICD-10-CM | POA: Diagnosis not present

## 2021-11-02 DIAGNOSIS — R55 Syncope and collapse: Secondary | ICD-10-CM | POA: Diagnosis not present

## 2021-11-09 DIAGNOSIS — C61 Malignant neoplasm of prostate: Secondary | ICD-10-CM | POA: Diagnosis not present

## 2021-11-09 DIAGNOSIS — E7849 Other hyperlipidemia: Secondary | ICD-10-CM | POA: Diagnosis not present

## 2021-11-09 DIAGNOSIS — R7301 Impaired fasting glucose: Secondary | ICD-10-CM | POA: Diagnosis not present

## 2021-11-09 DIAGNOSIS — I10 Essential (primary) hypertension: Secondary | ICD-10-CM | POA: Diagnosis not present

## 2021-11-15 DIAGNOSIS — I10 Essential (primary) hypertension: Secondary | ICD-10-CM | POA: Diagnosis not present

## 2021-11-15 DIAGNOSIS — E7849 Other hyperlipidemia: Secondary | ICD-10-CM | POA: Diagnosis not present

## 2021-11-15 DIAGNOSIS — Z0001 Encounter for general adult medical examination with abnormal findings: Secondary | ICD-10-CM | POA: Diagnosis not present

## 2021-11-15 DIAGNOSIS — C61 Malignant neoplasm of prostate: Secondary | ICD-10-CM | POA: Diagnosis not present

## 2021-11-30 DIAGNOSIS — L03113 Cellulitis of right upper limb: Secondary | ICD-10-CM | POA: Diagnosis not present

## 2021-11-30 DIAGNOSIS — M7021 Olecranon bursitis, right elbow: Secondary | ICD-10-CM | POA: Diagnosis not present

## 2021-11-30 DIAGNOSIS — Z6825 Body mass index (BMI) 25.0-25.9, adult: Secondary | ICD-10-CM | POA: Diagnosis not present

## 2022-01-17 DIAGNOSIS — H31093 Other chorioretinal scars, bilateral: Secondary | ICD-10-CM | POA: Diagnosis not present

## 2022-01-24 DIAGNOSIS — H353122 Nonexudative age-related macular degeneration, left eye, intermediate dry stage: Secondary | ICD-10-CM | POA: Diagnosis not present

## 2022-01-24 DIAGNOSIS — H43813 Vitreous degeneration, bilateral: Secondary | ICD-10-CM | POA: Diagnosis not present

## 2022-01-24 DIAGNOSIS — H35372 Puckering of macula, left eye: Secondary | ICD-10-CM | POA: Diagnosis not present

## 2022-01-24 DIAGNOSIS — H353211 Exudative age-related macular degeneration, right eye, with active choroidal neovascularization: Secondary | ICD-10-CM | POA: Diagnosis not present

## 2022-10-21 IMAGING — DX DG HIP (WITH OR WITHOUT PELVIS) 2-3V*L*
3 series · 3 of 3 positions shown · non-contrast
Comparison: None.

CLINICAL DATA: 5 week history of left hip pain.

EXAM:
DG HIP (WITH OR WITHOUT PELVIS) 2-3V LEFT

[pelvis ap]
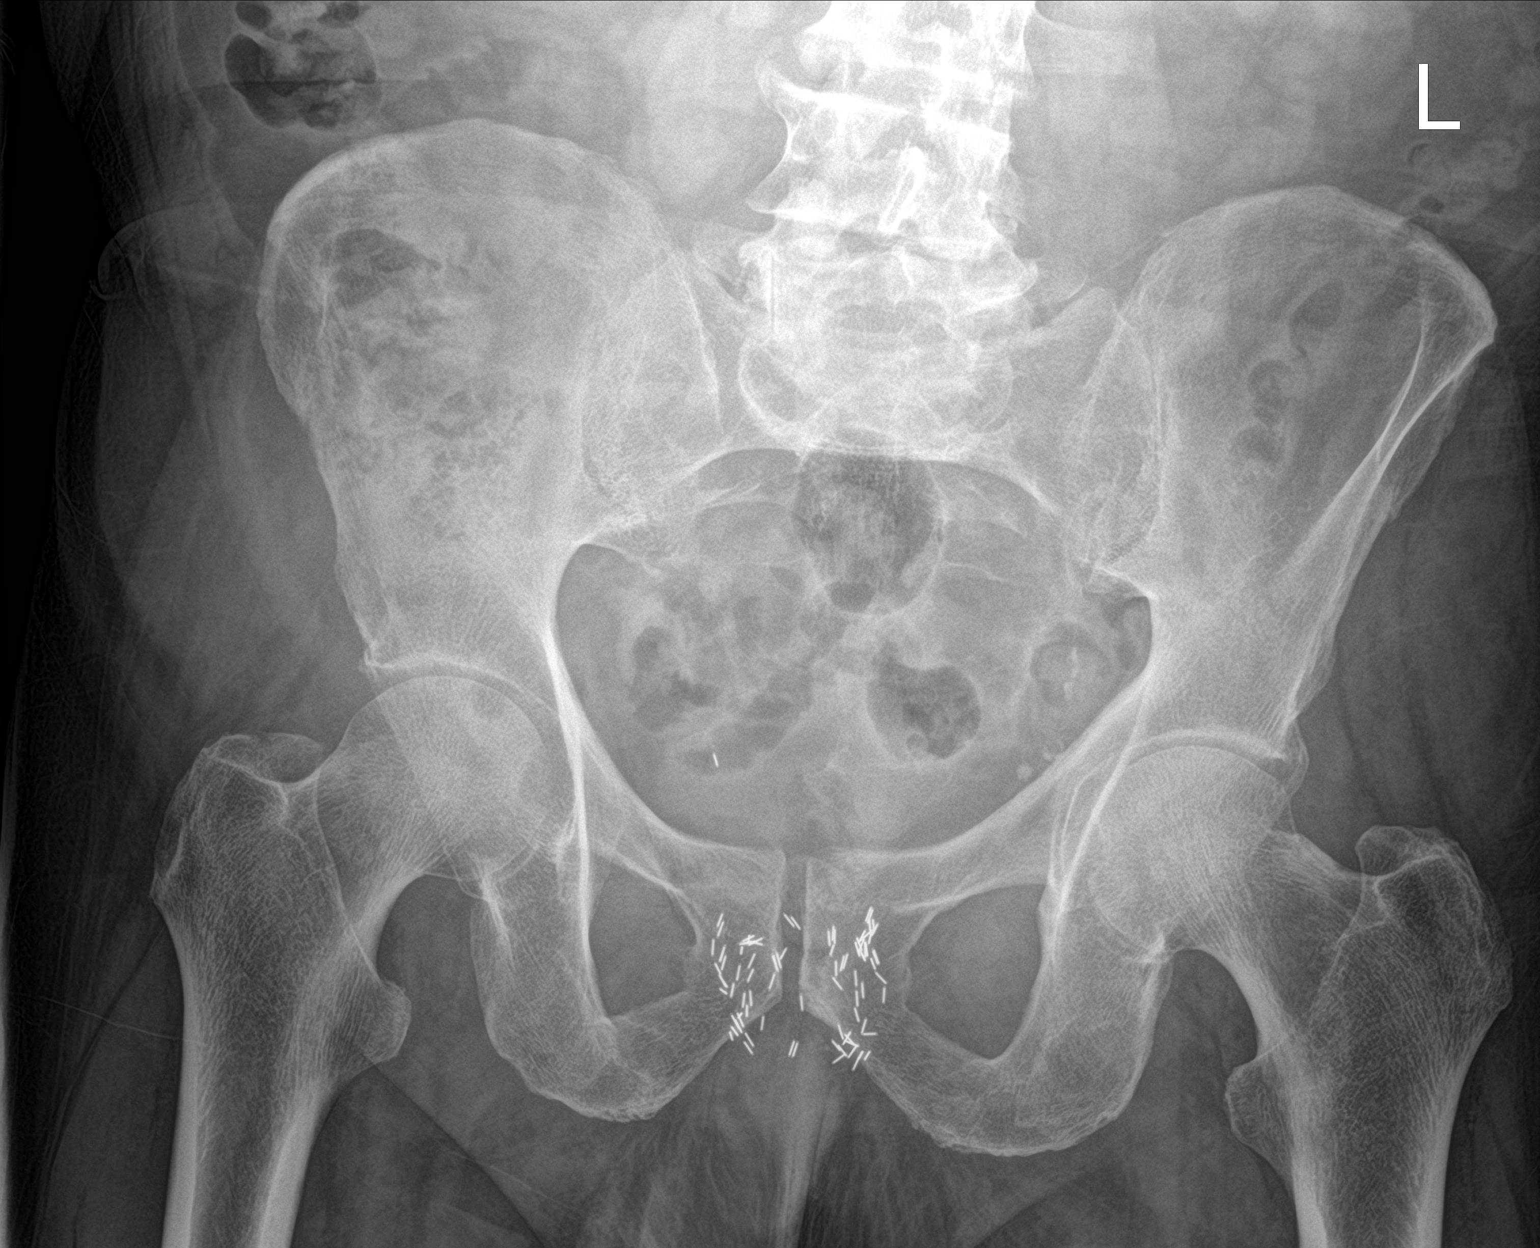

[hip ap]
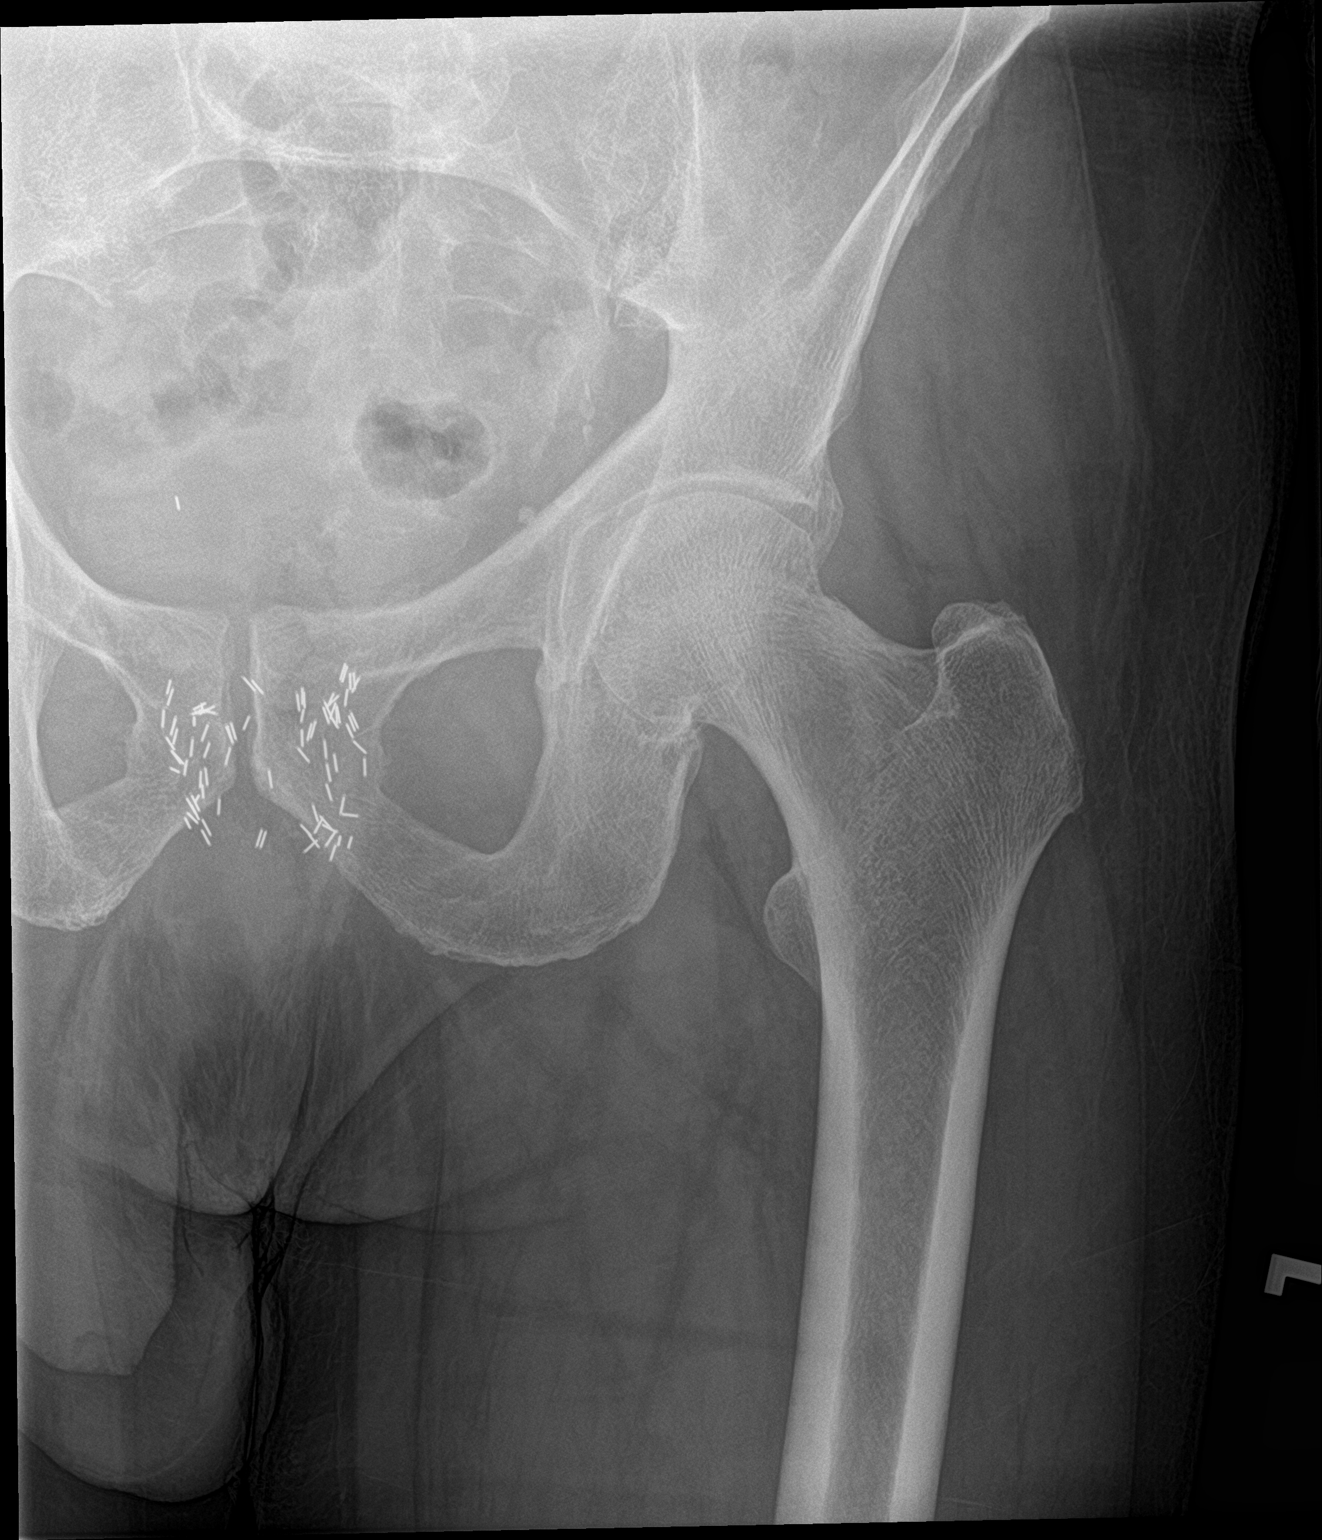

[hip lat]
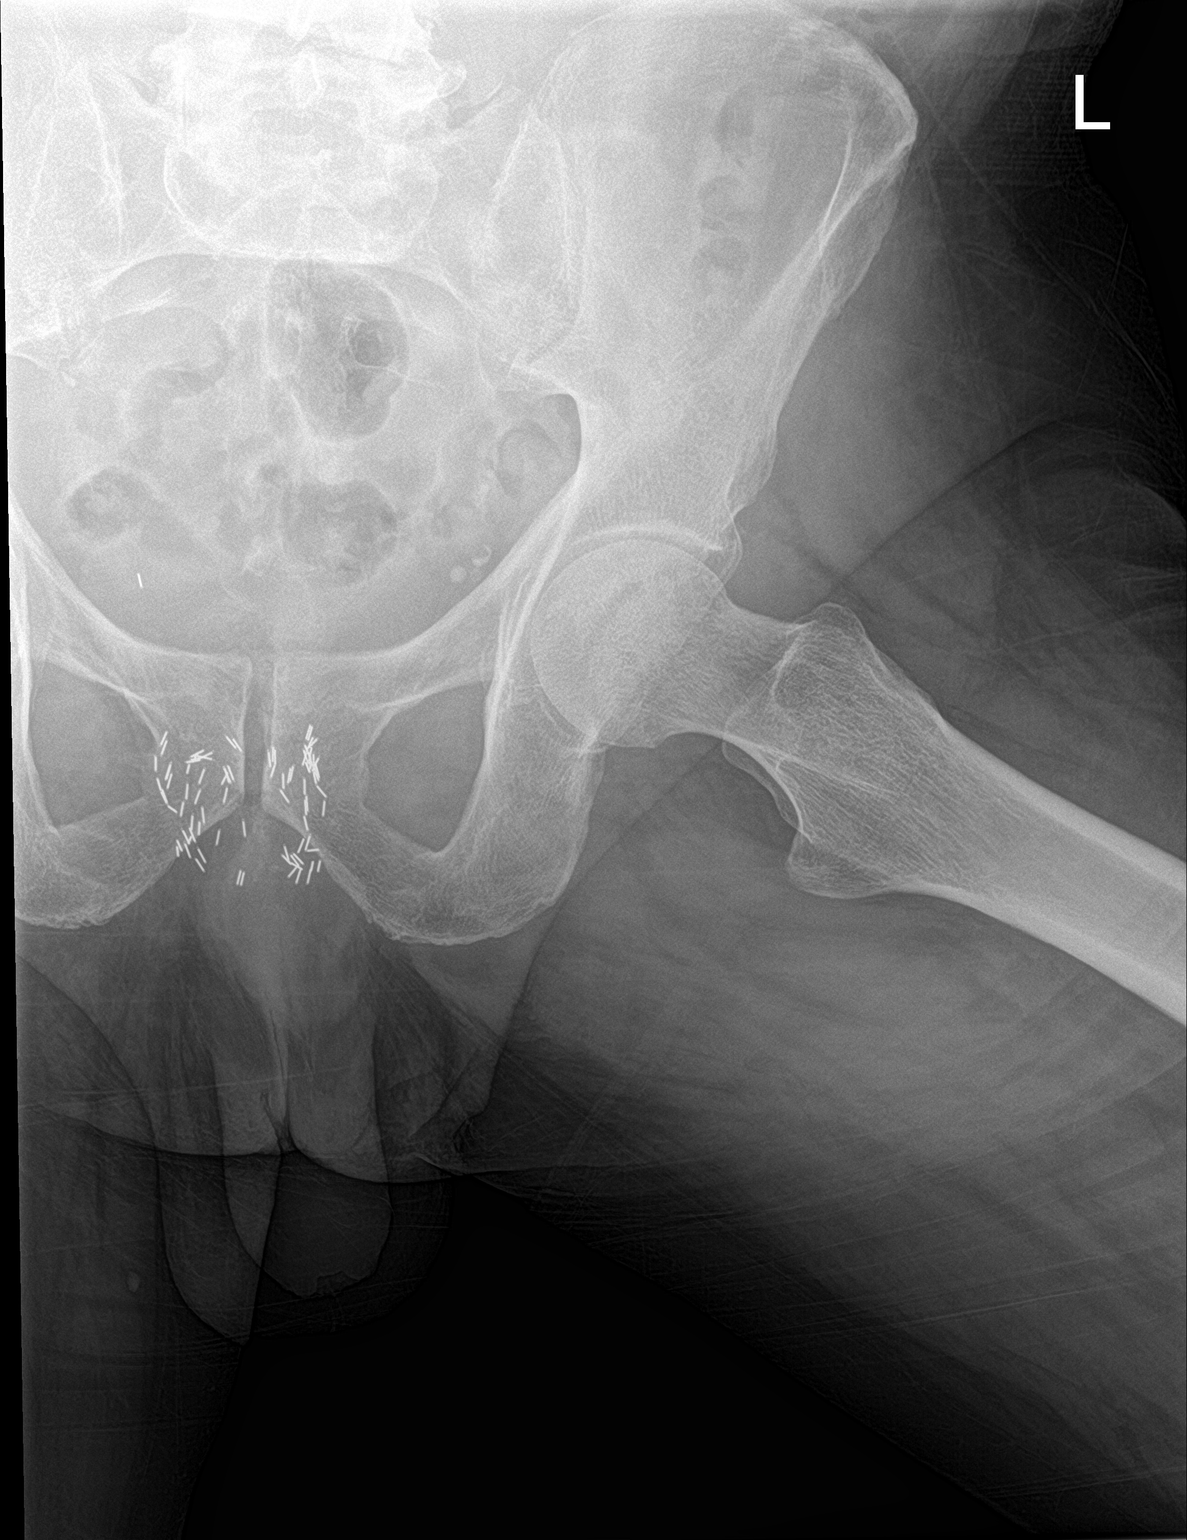

[3 of 3 positions shown; findings below may reference images not displayed]

FINDINGS: Frontal pelvis shows no fracture. SI joints and symphysis pubis
unremarkable. Brachytherapy seeds noted in the prostate bed. AP and
frog-leg lateral views of the left hip show no femoral neck
fracture. There is no suspicious lytic or sclerotic osseous
abnormality in the proximal left femur.

Patient is noted to have a subtle small lucency projecting over the
right femoral head/acetabulum.
IMPRESSION: No acute bony abnormality involving the anatomy of the left hip.

Small lucency projects over the right femoral head/acetabulum.
Dedicated right hip x-rays recommended to further evaluate.

## 2023-05-08 ENCOUNTER — Encounter: Payer: Self-pay | Admitting: *Deleted

## 2023-05-08 DIAGNOSIS — D696 Thrombocytopenia, unspecified: Secondary | ICD-10-CM

## 2023-05-08 DIAGNOSIS — C61 Malignant neoplasm of prostate: Secondary | ICD-10-CM

## 2023-05-08 DIAGNOSIS — E538 Deficiency of other specified B group vitamins: Secondary | ICD-10-CM

## 2023-05-08 DIAGNOSIS — G500 Trigeminal neuralgia: Secondary | ICD-10-CM | POA: Insufficient documentation

## 2023-05-09 ENCOUNTER — Encounter: Payer: Self-pay | Admitting: Diagnostic Neuroimaging

## 2023-05-09 ENCOUNTER — Ambulatory Visit: Payer: Medicare Other | Admitting: Diagnostic Neuroimaging

## 2023-05-09 VITALS — BP 140/83 | HR 65 | Ht 70.0 in | Wt 181.4 lb

## 2023-05-09 DIAGNOSIS — G500 Trigeminal neuralgia: Secondary | ICD-10-CM

## 2023-05-09 DIAGNOSIS — H543 Unqualified visual loss, both eyes: Secondary | ICD-10-CM | POA: Diagnosis not present

## 2023-05-09 MED ORDER — GABAPENTIN 300 MG PO CAPS: 300.0000 mg | ORAL_CAPSULE | Freq: Three times a day (TID) | ORAL | 11 refills | Status: AC

## 2023-05-09 NOTE — Progress Notes (Signed)
[facility]  PATIENT: Don Williams DOB: [birth year]  REFERRING CLINICIAN: Ignatius Specking, MD HISTORY FROM: patient  REASON FOR VISIT: new consult   HISTORICAL  CHIEF COMPLAINT:  Chief Complaint  Patient presents with   New Patient (Initial Visit)    Rm 6, wife, Deanie.  Seen for TN previously 3 yrs ago, took 300mg  gabapentin.  Has tried pregabalin 75mg  , now on gabapentin 100mg  po bid. L facial pain.     HISTORY OF PRESENT ILLNESS:   83 year old male here for evaluation of left trigeminal neuralgia.  2022 patient had onset of intermittent electrical shocklike pain in the left face affecting the left forehead, left maxillary and left lower facial region.  Symptoms could be triggered by smiling, chewing, touching or brushing teeth.  He was treated with gabapentin 3 to milligrams twice a day for several months and symptoms eventually resolved within 6 months.  Symptoms returned in February 2024.  He was restarted on gabapentin 100 mg twice a day.  This did not help symptoms.  He was tried on pregabalin 75 mg twice a day which did not help.  Has not seen a dentist since facial pain symptoms have returned.  Has been having some issues with progressive vision loss in the left greater than right eye.  Has been diagnosed with macular degeneration.  No problems with arms or legs.   REVIEW OF SYSTEMS: Full 14 system review of systems performed and negative with exception of: as per HPI.  ALLERGIES: No Known Allergies  HOME MEDICATIONS: Outpatient Medications Prior to Visit  Medication Sig Dispense Refill   Cyanocobalamin (VITAMIN B12) 3000 MCG SUBL Place 1 tablet under the tongue daily.     metoprolol tartrate (LOPRESSOR) 50 MG tablet Take 50 mg by mouth 2 (two) times daily.     Multiple Vitamins-Minerals (MULTIVITAMIN PO) Take 1 tablet by mouth daily.     naproxen sodium (ALEVE) 220 MG tablet Take 220 mg by mouth daily as needed.     rosuvastatin (CRESTOR) 10  MG tablet Take 10 mg by mouth daily.     sildenafil (VIAGRA) 100 MG tablet Take 100 mg by mouth daily as needed for erectile dysfunction.     gabapentin (NEURONTIN) 100 MG capsule Take 100 mg by mouth 2 (two) times daily.     acetaminophen (TYLENOL) 500 MG tablet Take 500 mg by mouth every 6 (six) hours as needed for moderate pain or headache.      atenolol (TENORMIN) 50 MG tablet Take 50 mg by mouth 2 (two) times daily.     Ibuprofen-diphenhydrAMINE HCl (IBUPROFEN PM) 200-25 MG CAPS Take 1 capsule by mouth at bedtime as needed (sleep).     simvastatin (ZOCOR) 40 MG tablet Take 40 mg by mouth at bedtime.      No facility-administered medications prior to visit.    PAST MEDICAL HISTORY: Past Medical History:  Diagnosis Date   Anxiety    High cholesterol    Hypertension    Positive colorectal cancer screening using Cologuard test    Prostate cancer (HCC)    Thrombocytopenia, unspecified (HCC)    Trigeminal neuralgia    Vitamin B12 deficiency     PAST SURGICAL HISTORY: Past Surgical History:  Procedure Laterality Date   BIOPSY  07/17/2017   Procedure: BIOPSY;  Surgeon: Malissa Hippo, MD;  Location: [facility];  Service: Endoscopy;;  ileocecal valve   COLONOSCOPY N/A 07/17/2017   Procedure: COLONOSCOPY;  Surgeon: Malissa Hippo, MD;  Location: [facility];  Service: Endoscopy;  Laterality: N/A;  7:30   HERNIA REPAIR     wit hmesh   POLYPECTOMY  07/17/2017   Procedure: POLYPECTOMY;  Surgeon: Malissa Hippo, MD;  Location: [facility];  Service: Endoscopy;;  transverse, cecal x3; ascending x4   PROSTATE SURGERY     hx of prostage cancer with implanted seeds    FAMILY HISTORY: Family History  Problem Relation Age of Onset   Cancer Brother    Heart attack Brother     SOCIAL HISTORY: Social History   Socioeconomic History   Marital status: Not on file    Spouse name: Not on file   Number of children: Not on file   Years of education: Not on file   Highest  education level: Not on file  Occupational History   Not on file  Tobacco Use   Smoking status: Never   Smokeless tobacco: Never  Vaping Use   Vaping status: Never Used  Substance and Sexual Activity   Alcohol use: Never   Drug use: Never   Sexual activity: Not on file  Other Topics Concern   Not on file  Social History Narrative   Lives home with wife Deanie   Retired   Education 12 yrs   Children 4   Coffee 1 cup daily   Social Drivers of Corporate investment banker Strain: Not on file  Food Insecurity: Not on file  Transportation Needs: Not on file  Physical Activity: Not on file  Stress: Not on file  Social Connections: Not on file  Intimate Partner Violence: Not on file     PHYSICAL EXAM  GENERAL EXAM/CONSTITUTIONAL: Vitals:  Vitals:   05/09/23 0832 05/09/23 0856  BP: (!) 159/83 (!) 140/83  Pulse: 61 65  Weight: 181 lb 6.4 oz (82.3 kg)   Height: 5\' 10"  (1.778 m)    Body mass index is 26.03 kg/m. Wt Readings from Last 3 Encounters:  05/09/23 181 lb 6.4 oz (82.3 kg)  10/25/21 175 lb (79.4 kg)  05/05/21 184 lb (83.5 kg)   Patient is in no distress; well developed, nourished and groomed; neck is supple  CARDIOVASCULAR: Examination of carotid arteries is normal; no carotid bruits Regular rate and rhythm, no murmurs Examination of peripheral vascular system by observation and palpation is normal  EYES: Ophthalmoscopic exam of optic discs and posterior segments is normal; no papilledema or hemorrhages No results found.  MUSCULOSKELETAL: Gait, strength, tone, movements noted in Neurologic exam below  NEUROLOGIC: MENTAL STATUS:      No data to display         awake, alert, oriented to person, place and time recent and remote memory intact normal attention and concentration language fluent, comprehension intact, naming intact fund of knowledge appropriate  CRANIAL NERVE:  2nd - no papilledema on fundoscopic exam 2nd, 3rd, 4th, 6th - pupils  equal and reactive to light, visual fields full to confrontation, extraocular muscles intact, no nystagmus 5th - facial sensation symmetric 7th - facial strength symmetric 8th - hearing intact 9th - palate elevates symmetrically, uvula midline 11th - shoulder shrug symmetric 12th - tongue protrusion midline  MOTOR:  normal bulk and tone, full strength in the BUE, BLE  SENSORY:  normal and symmetric to light touch, temperature, vibration  COORDINATION:  finger-nose-finger, fine finger movements normal  REFLEXES:  deep tendon reflexes present and symmetric  GAIT/STATION:  narrow based gait     DIAGNOSTIC DATA (LABS, IMAGING, TESTING) -  I reviewed patient records, labs, notes, testing and imaging myself where available.  No results found for: "WBC", "HGB", "HCT", "MCV", "PLT" No results found for: "NA", "K", "CL", "CO2", "GLUCOSE", "BUN", "CREATININE", "CALCIUM", "PROT", "ALBUMIN", "AST", "ALT", "ALKPHOS", "BILITOT", "GFRNONAA", "GFRAA" No results found for: "CHOL", "HDL", "LDLCALC", "LDLDIRECT", "TRIG", "CHOLHDL" No results found for: "HGBA1C" No results found for: "VITAMINB12" No results found for: "TSH"    ASSESSMENT AND PLAN  83 y.o. year old male here with:  Dx:  1. Left-sided trigeminal neuralgia   2. Vision loss, bilateral     PLAN:  Left facial pain (trigeminal neuralgia) - check MRI brain / trigeminal - check CT maxillofacial  - increase gabapentin to 300mg  twice a day (may increase up to 600mg  twice a day or three times a day as tolerated)  Orders Placed This Encounter  Procedures   MR BRAIN W WO CONTRAST   MR FACE/TRIGEMINAL WO/W CM   CT MAXILLOFACIAL WO CONTRAST   Meds ordered this encounter  Medications   gabapentin (NEURONTIN) 300 MG capsule    Sig: Take 1-2 capsules (300-600 mg total) by mouth 3 (three) times daily.    Dispense:  180 capsule    Refill:  11   Return in about 6 months (around 11/06/2023) for MyChart visit (15  min).    Suanne Marker, MD 05/09/2023, 9:46 AM Certified in Neurology, Neurophysiology and Neuroimaging  [facility] [address], [address] [place], [place] [address] [phone]

## 2023-05-09 NOTE — Patient Instructions (Signed)
  Left facial pain (trigeminal neuralgia) - check MRI brain / trigeminal - check CT maxillofacial  - increase gabapentin to 300mg  twice a day (may increase up to 600mg  twice a day or three times a day as tolerated)

## 2023-05-14 ENCOUNTER — Telehealth: Payer: Self-pay | Admitting: Diagnostic Neuroimaging

## 2023-05-14 NOTE — Telephone Encounter (Signed)
 CT [employer] medicare auth: [id number] exp. 05/14/23-06/12/23  MRI brain & face [employer] medicare auth: [id number] exp. 05/14/23-06/12/23 sent to GI [phone]

## 2023-06-03 ENCOUNTER — Ambulatory Visit
Admission: RE | Admit: 2023-06-03 | Discharge: 2023-06-03 | Discharge status: Home / Self Care | Payer: Medicare Other | Source: Ambulatory Visit | Attending: Diagnostic Neuroimaging | Admitting: Diagnostic Neuroimaging

## 2023-06-03 ENCOUNTER — Ambulatory Visit
Admission: RE | Admit: 2023-06-03 | Discharge: 2023-06-03 | Disposition: A | Discharge status: Home / Self Care | Source: Ambulatory Visit | Attending: Diagnostic Neuroimaging | Admitting: Diagnostic Neuroimaging

## 2023-06-03 DIAGNOSIS — G500 Trigeminal neuralgia: Secondary | ICD-10-CM

## 2023-06-03 DIAGNOSIS — H543 Unqualified visual loss, both eyes: Secondary | ICD-10-CM

## 2023-06-03 MED ORDER — GADOPICLENOL 0.5 MMOL/ML IV SOLN
9.0000 mL | Freq: Once | INTRAVENOUS | Status: AC | PRN
  Administered 2023-06-03: 9 mL via INTRAVENOUS

## 2023-06-06 NOTE — Progress Notes (Signed)
 Results are good, no major findings. Continue current plan. -VRP

## 2023-06-19 ENCOUNTER — Encounter: Payer: Self-pay | Admitting: Diagnostic Neuroimaging

## 2023-06-19 NOTE — Telephone Encounter (Signed)
See other message from pt.

## 2023-07-07 ENCOUNTER — Encounter: Payer: Self-pay | Admitting: Diagnostic Neuroimaging
